# Patient Record
Sex: Male | Born: 1990 | Race: Black or African American | Hispanic: No | Marital: Single | Smoking: Current every day smoker
Health system: Southern US, Community
[De-identification: ages and names within clinical notes are randomized; demographics above are authoritative.]

## PROBLEM LIST (undated history)

## (undated) DIAGNOSIS — D573 Sickle-cell trait: Secondary | ICD-10-CM

## (undated) DIAGNOSIS — S60312A Abrasion of left thumb, initial encounter: Secondary | ICD-10-CM

## (undated) DIAGNOSIS — I7300 Raynaud's syndrome without gangrene: Secondary | ICD-10-CM

## (undated) DIAGNOSIS — M2652 Limited mandibular range of motion: Secondary | ICD-10-CM

## (undated) DIAGNOSIS — S02609A Fracture of mandible, unspecified, initial encounter for closed fracture: Secondary | ICD-10-CM

---

## 2012-08-23 ENCOUNTER — Emergency Department (HOSPITAL_COMMUNITY)
Admission: EM | Admit: 2012-08-23 | Discharge: 2012-08-23 | Disposition: A | Discharge status: Home / Self Care | Payer: Self-pay | Attending: Emergency Medicine | Admitting: Emergency Medicine

## 2012-08-23 ENCOUNTER — Encounter (HOSPITAL_COMMUNITY): Payer: Self-pay | Admitting: Emergency Medicine

## 2012-08-23 DIAGNOSIS — M25561 Pain in right knee: Secondary | ICD-10-CM

## 2012-08-23 DIAGNOSIS — M25569 Pain in unspecified knee: Secondary | ICD-10-CM | POA: Insufficient documentation

## 2012-08-23 DIAGNOSIS — M25579 Pain in unspecified ankle and joints of unspecified foot: Secondary | ICD-10-CM | POA: Insufficient documentation

## 2012-08-23 HISTORY — DX: Raynaud's syndrome without gangrene: I73.00

## 2012-08-23 NOTE — ED Notes (Signed)
States right knee "gave out" yesterday while at home. Pt does play a lot of basketball but was not playing when the injury occurred.

## 2012-08-23 NOTE — ED Provider Notes (Signed)
History     CSN: [id number]  Arrival date & time 08/23/12  1508   First MD Initiated Contact with Patient 08/23/12 1520      No chief complaint on file.   (Consider location/radiation/quality/duration/timing/severity/associated sxs/prior treatment) HPI  21 year old male presents complaining of right knee pain. Patient reports he plays basketball often and has had trouble with his right leg including high ankle sprain, bursitis, and general wear and tear. Yesterday while walking patient reports his "knees gave out". He denies falling or injuring any body parts. He does complaining of achy pain to his right knee worsened throughout the night but has improved. He did take some ibuprofen for pain. He denies hip or ankle pain. He denies swelling, or bleeding. Reports his knee has never "gave out" before.    No past medical history on file.  No past surgical history on file.  No family history on file.  History  Substance Use Topics  . Smoking status: Not on file  . Smokeless tobacco: Not on file  . Alcohol Use: Not on file      Review of Systems  Constitutional: Negative for fever.  Musculoskeletal: Positive for arthralgias. Negative for joint swelling.  Skin: Negative for rash and wound.  Neurological: Negative for numbness.    Allergies  Review of patient's allergies indicates not on file.  Home Medications  No current outpatient prescriptions on file.  There were no vitals taken for this visit.  Physical Exam  Nursing note and vitals reviewed. Constitutional: He appears well-developed and well-nourished. No distress.  HENT:  Head: Atraumatic.  Eyes: Conjunctivae normal are normal.  Neck: Neck supple.  Musculoskeletal:       Right hip: Normal.       Right knee: He exhibits normal range of motion, no swelling, no effusion, no ecchymosis, no deformity, no erythema, no LCL laxity, normal patellar mobility, no bony tenderness, normal meniscus and no MCL laxity.  tenderness found. Patellar tendon tenderness noted. No MCL tenderness noted.       Right ankle: Normal.       Legs: Neurological: He is alert.  Skin: Skin is warm. No rash noted.  Psychiatric: He has a normal mood and affect.    ED Course  Procedures (including critical care time)  Labs Reviewed - No data to display No results found.   No diagnosis found.  1. R ankle pain  MDM  Tenderness to the infrapatellar region of right knee without deformity. No laxity, overlying skin changes, or swelling noted. Patient able to ambulate without difficulty. leg without palpable cords, erythema or swelling. X-ray is not warranted although offered, the patient declined. Knee sleeve given for support, although referral given. Recommend ibuprofen and RICE therapy.     BP 129/90  Pulse 90  Temp 98.7 F (37.1 C) (Oral)  Resp 18  SpO2 100%      Fayrene Helper, PA-C 08/23/12 1535

## 2012-08-23 NOTE — ED Provider Notes (Signed)
Medical screening examination/treatment/procedure(s) were performed by non-physician practitioner and as supervising physician I was immediately available for consultation/collaboration.  Kilie Rund, MD 08/23/12 2313 

## 2013-06-20 ENCOUNTER — Emergency Department (HOSPITAL_COMMUNITY): Payer: PRIVATE HEALTH INSURANCE | Admitting: Registered Nurse

## 2013-06-20 ENCOUNTER — Emergency Department (HOSPITAL_COMMUNITY): Payer: PRIVATE HEALTH INSURANCE

## 2013-06-20 ENCOUNTER — Encounter (HOSPITAL_COMMUNITY): Payer: Self-pay | Admitting: Registered Nurse

## 2013-06-20 ENCOUNTER — Encounter (HOSPITAL_COMMUNITY)
Admission: EM | Disposition: A | Discharge status: Home / Self Care | Payer: Self-pay | Source: Home / Self Care | Attending: Emergency Medicine

## 2013-06-20 ENCOUNTER — Observation Stay (HOSPITAL_COMMUNITY)
Admission: EM | Admit: 2013-06-20 | Discharge: 2013-06-21 | Disposition: A | Discharge status: Home / Self Care | Payer: PRIVATE HEALTH INSURANCE | Attending: Otolaryngology | Admitting: Otolaryngology

## 2013-06-20 ENCOUNTER — Encounter (HOSPITAL_COMMUNITY): Payer: Self-pay | Admitting: Emergency Medicine

## 2013-06-20 DIAGNOSIS — W2203XA Walked into furniture, initial encounter: Secondary | ICD-10-CM | POA: Insufficient documentation

## 2013-06-20 DIAGNOSIS — S02609A Fracture of mandible, unspecified, initial encounter for closed fracture: Principal | ICD-10-CM | POA: Insufficient documentation

## 2013-06-20 DIAGNOSIS — I7300 Raynaud's syndrome without gangrene: Secondary | ICD-10-CM | POA: Insufficient documentation

## 2013-06-20 HISTORY — DX: Sickle-cell trait: D57.3

## 2013-06-20 HISTORY — DX: Fracture of mandible, unspecified, initial encounter for closed fracture: S02.609A

## 2013-06-20 HISTORY — PX: ORIF MANDIBULAR FRACTURE: SHX2127

## 2013-06-20 SURGERY — OPEN REDUCTION INTERNAL FIXATION (ORIF) MANDIBULAR FRACTURE
Anesthesia: General | Site: Mouth | Wound class: Clean Contaminated

## 2013-06-20 MED ORDER — HYDROMORPHONE HCL PF 1 MG/ML IJ SOLN
INTRAMUSCULAR | Status: AC
  Filled 2013-06-20: qty 1

## 2013-06-20 MED ORDER — CLINDAMYCIN PHOSPHATE 600 MG/50ML IV SOLN
INTRAVENOUS | Status: DC | PRN
  Administered 2013-06-20: 600 mg via INTRAVENOUS

## 2013-06-20 MED ORDER — OXYMETAZOLINE HCL 0.05 % NA SOLN
NASAL | Status: AC
  Filled 2013-06-20: qty 15

## 2013-06-20 MED ORDER — SUCCINYLCHOLINE CHLORIDE 20 MG/ML IJ SOLN
INTRAMUSCULAR | Status: DC | PRN
  Administered 2013-06-20: 100 mg via INTRAVENOUS

## 2013-06-20 MED ORDER — LIDOCAINE-EPINEPHRINE 2 %-1:100000 IJ SOLN
INTRAMUSCULAR | Status: AC
  Filled 2013-06-20: qty 6.8

## 2013-06-20 MED ORDER — CLINDAMYCIN PHOSPHATE 600 MG/50ML IV SOLN
600.0000 mg | Freq: Once | INTRAVENOUS | Status: DC
  Filled 2013-06-20: qty 50

## 2013-06-20 MED ORDER — LIDOCAINE HCL (CARDIAC) 20 MG/ML IV SOLN
INTRAVENOUS | Status: DC | PRN
  Administered 2013-06-20: 50 mg via INTRAVENOUS

## 2013-06-20 MED ORDER — LIDOCAINE HCL 1 % IJ SOLN
INTRAMUSCULAR | Status: AC
  Filled 2013-06-20: qty 20

## 2013-06-20 MED ORDER — SUFENTANIL CITRATE 50 MCG/ML IV SOLN
INTRAVENOUS | Status: DC | PRN
  Administered 2013-06-20: 5 ug via INTRAVENOUS
  Administered 2013-06-20: 10 ug via INTRAVENOUS
  Administered 2013-06-20: 5 ug via INTRAVENOUS

## 2013-06-20 MED ORDER — LACTATED RINGERS IV SOLN: INTRAVENOUS | Status: DC

## 2013-06-20 MED ORDER — DEXAMETHASONE SODIUM PHOSPHATE 10 MG/ML IJ SOLN
INTRAMUSCULAR | Status: DC | PRN
  Administered 2013-06-20: 10 mg via INTRAVENOUS

## 2013-06-20 MED ORDER — CLINDAMYCIN HCL 300 MG PO CAPS: 300.0000 mg | ORAL_CAPSULE | Freq: Three times a day (TID) | ORAL | Status: DC

## 2013-06-20 MED ORDER — 0.9 % SODIUM CHLORIDE (POUR BTL) OPTIME
TOPICAL | Status: DC | PRN
  Administered 2013-06-20: 1000 mL

## 2013-06-20 MED ORDER — HYDROMORPHONE HCL PF 1 MG/ML IJ SOLN
0.2500 mg | INTRAMUSCULAR | Status: DC | PRN
  Administered 2013-06-20 (×2): 0.5 mg via INTRAVENOUS

## 2013-06-20 MED ORDER — ROCURONIUM BROMIDE 100 MG/10ML IV SOLN
INTRAVENOUS | Status: DC | PRN
  Administered 2013-06-20: 5 mg via INTRAVENOUS

## 2013-06-20 MED ORDER — PROPOFOL 10 MG/ML IV BOLUS
INTRAVENOUS | Status: DC | PRN
  Administered 2013-06-20: 180 mg via INTRAVENOUS

## 2013-06-20 MED ORDER — PROMETHAZINE HCL 25 MG RE SUPP: 25.0000 mg | Freq: Four times a day (QID) | RECTAL | Status: DC | PRN

## 2013-06-20 MED ORDER — PROMETHAZINE HCL 25 MG/ML IJ SOLN: 6.2500 mg | INTRAMUSCULAR | Status: DC | PRN

## 2013-06-20 MED ORDER — OXYMETAZOLINE HCL 0.05 % NA SOLN
NASAL | Status: DC | PRN
  Administered 2013-06-20: 2 via NASAL

## 2013-06-20 MED ORDER — HYDROCODONE-ACETAMINOPHEN 7.5-325 MG/15ML PO SOLN: 15.0000 mL | ORAL | Status: DC | PRN

## 2013-06-20 MED ORDER — LIDOCAINE-EPINEPHRINE (PF) 1 %-1:200000 IJ SOLN
INTRAMUSCULAR | Status: DC | PRN
  Administered 2013-06-20: 2 mL

## 2013-06-20 MED ORDER — LACTATED RINGERS IV SOLN
INTRAVENOUS | Status: DC | PRN
  Administered 2013-06-20: 21:00:00 via INTRAVENOUS

## 2013-06-20 MED ORDER — MIDAZOLAM HCL 5 MG/5ML IJ SOLN
INTRAMUSCULAR | Status: DC | PRN
  Administered 2013-06-20: 2 mg via INTRAVENOUS

## 2013-06-20 MED ORDER — LIDOCAINE-EPINEPHRINE (PF) 1 %-1:200000 IJ SOLN
INTRAMUSCULAR | Status: AC
  Filled 2013-06-20: qty 10

## 2013-06-20 SURGICAL SUPPLY — 43 items
ATTRACTOMAT 16X20 MAGNETIC DRP (DRAPES) IMPLANT
BIT DRILL TWIST 1.3X35MM (BIT) ×1 IMPLANT
BLADE SURG 15 STRL LF DISP TIS (BLADE) ×2 IMPLANT
BLADE SURG 15 STRL SS (BLADE) ×2
CLEANER TIP ELECTROSURG 2X2 (MISCELLANEOUS) ×2 IMPLANT
CLOTH BEACON ORANGE TIMEOUT ST (SAFETY) ×2 IMPLANT
COVER SURGICAL LIGHT HANDLE (MISCELLANEOUS) ×2 IMPLANT
DEPRESSOR TONGUE BLADE STERILE (MISCELLANEOUS) IMPLANT
DRILL TWIST 1.3X35MM (BIT) ×2
ELECT COATED BLADE 2.86 ST (ELECTRODE) ×2 IMPLANT
ELECT NEEDLE TIP 2.8 STRL (NEEDLE) IMPLANT
ELECT REM PT RETURN 9FT ADLT (ELECTROSURGICAL) ×2
ELECTRODE REM PT RTRN 9FT ADLT (ELECTROSURGICAL) ×1 IMPLANT
GAUZE SPONGE 4X4 16PLY XRAY LF (GAUZE/BANDAGES/DRESSINGS) ×2 IMPLANT
KIT BASIN OR (CUSTOM PROCEDURE TRAY) ×2 IMPLANT
NEEDLE 27GAX1X1/2 (NEEDLE) ×2 IMPLANT
NEEDLE HYPO 25X1 1.5 SAFETY (NEEDLE) IMPLANT
NS IRRIG 1000ML POUR BTL (IV SOLUTION) ×2 IMPLANT
PACK EENT SPLIT (PACKS) ×2 IMPLANT
PACKING VAGINAL (PACKING) IMPLANT
PENCIL BUTTON HOLSTER BLD 10FT (ELECTRODE) ×2 IMPLANT
RUBBERBAND STERILE (MISCELLANEOUS) IMPLANT
SCREW 2.0X12MM (Screw) ×4 IMPLANT
SCREW MNDBLE 2.0X8 LOCKING (Screw) ×4 IMPLANT
SOL PREP POV-IOD 16OZ 10% (MISCELLANEOUS) IMPLANT
SPONGE GAUZE 4X4 12PLY (GAUZE/BANDAGES/DRESSINGS) IMPLANT
SUT BONE WAX W31G (SUTURE) IMPLANT
SUT STEEL 0 (SUTURE)
SUT STEEL 0 18XMFL TIE 12 (SUTURE)
SUT STEEL 2 (SUTURE) ×2 IMPLANT
SUT VIC AB 3-0 PS2 18 (SUTURE)
SUT VIC AB 3-0 PS2 18XBRD (SUTURE) IMPLANT
SUT VIC AB 4-0 P-3 18XBRD (SUTURE) IMPLANT
SUT VIC AB 4-0 P3 18 (SUTURE)
SUT VIC AB 5-0 P-3 18XBRD (SUTURE) IMPLANT
SUT VIC AB 5-0 P3 18 (SUTURE)
SUTURE STEEL 0 18XMFL TIE 12 (SUTURE) IMPLANT
SYR 50ML LL SCALE MARK (SYRINGE) IMPLANT
SYR BULB IRRIGATION 50ML (SYRINGE) ×2 IMPLANT
SYR CONTROL 10ML LL (SYRINGE) ×2 IMPLANT
TOOTHBRUSH ADULT (PERSONAL CARE ITEMS) IMPLANT
TOWEL OR 17X26 10 PK STRL BLUE (TOWEL DISPOSABLE) ×2 IMPLANT
WATER STERILE IRR 1500ML POUR (IV SOLUTION) IMPLANT

## 2013-06-20 NOTE — Preoperative (Signed)
Beta Blockers   Reason not to administer Beta Blockers:Not Applicable 

## 2013-06-20 NOTE — ED Provider Notes (Signed)
Medical screening examination/treatment/procedure(s) were performed by non-physician practitioner and as supervising physician I was immediately available for consultation/collaboration.    Nelia Shi, MD 06/20/13 2028

## 2013-06-20 NOTE — ED Notes (Signed)
Surgeon at bedside. Admission pending

## 2013-06-20 NOTE — Anesthesia Postprocedure Evaluation (Signed)
Anesthesia Post Note  Patient: Corey Flores  Procedure(s) Performed: Procedure(s) (LRB): OPEN REDUCTION INTERNAL FIXATION (ORIF) MANDIBULAR FRACTURE (N/A)  Anesthesia type: General  Patient location: PACU  Post pain: Pain level controlled  Post assessment: Post-op Vital signs reviewed  Last Vitals:  Filed Vitals:   06/20/13 1850  BP: 118/75  Pulse: 50  Temp:   Resp: 16    Post vital signs: Reviewed  Level of consciousness: sedated  Complications: No apparent anesthesia complications

## 2013-06-20 NOTE — ED Provider Notes (Signed)
History    This chart was scribed for non-physician practitioner Roxy Horseman PA-C, working with Nelia Shi, MD by Donne Anon, ED Scribe. This patient was seen in room WTR9/WTR9 and the patient's care was started at 1459.  CSN: [id number] Arrival date & time 06/20/13  1414  First MD Initiated Contact with Patient 06/20/13 1459     Chief Complaint  Patient presents with  . Jaw Pain    The history is provided by the patient. No language interpreter was used.   HPI Comments: Corey Flores is a 22 y.o. male who presents to the Emergency Department complaining of 3 days of sudden onset, gradually worsening, constant, non radiating, moderate right jaw pain with associated swelling that began when he accidentally hit his jaw on a cabinet in the bathroom. He states he accidentally hit his jaw again yesterday which worsened the pain. He rates his pain 6/10. He reports difficulty eating solid food. He has tried 800 mg ibuprofen and Orajel with little relief. He denies fever, nausea, vomiting, or any other pain.  Past Medical History  Diagnosis Date  . Raynaud's disease    History reviewed. No pertinent past surgical history. No family history on file. History  Substance Use Topics  . Smoking status: Not on file  . Smokeless tobacco: Not on file  . Alcohol Use: No    Review of Systems A complete 10 system review of systems was obtained and all systems are negative except as noted in the HPI and PMH.   Allergies  Sulfa antibiotics  Home Medications   Current Outpatient Rx  Name  Route  Sig  Dispense  Refill  . ibuprofen (ADVIL,MOTRIN) 200 MG tablet   Oral   Take 200 mg by mouth every 6 (six) hours as needed for pain (pain).          There were no vitals taken for this visit.  Physical Exam  Nursing note and vitals reviewed. Constitutional: He appears well-developed and well-nourished. No distress.  HENT:  Head: Normocephalic and atraumatic. No trismus in the jaw.   Mouth/Throat: Uvula is midline and oropharynx is clear and moist. No dental abscesses.  No broken teeth. No evidence of abscess. Oropharynx clear. Uvula midline. No sign of Ludwig's angina. No trismus. No evidence of TMJ. Mild tenderness to palpation of right lateral mandible.   Eyes: Conjunctivae are normal.  Neck: Neck supple. No tracheal deviation present.  Cardiovascular: Normal rate.   Pulmonary/Chest: Effort normal. No respiratory distress.  Musculoskeletal: Normal range of motion.  Neurological: He is alert.  Skin: Skin is warm and dry.  Psychiatric: He has a normal mood and affect. His behavior is normal.    ED Course  Procedures (including critical care time) DIAGNOSTIC STUDIES:   COORDINATION OF CARE: 3:13 PM Discussed treatment plan which includes facial xray with pt at bedside and pt agreed to plan.   3:54 PM Case discussed with Dr. Radford Pax, who recommenced consulting with Dr. Pollyann Kennedy from [facility].   4:10 PM Dr. Radford Pax spoke with Dr. Pollyann Kennedy who agreed to evaluate pt in the ED. Dr. Pollyann Kennedy requested pt be NPO until evaluated.   4:26 PM Rechecked pt. Discussed xray results and updated about consult with Dr. Pollyann Kennedy. Pt agrees to plan.   4:56 PM Dr. Pollyann Kennedy evaluated pt, who will admit pt for surgery.    Labs Reviewed - No data to display Dg Mandible 4 Views  06/20/2013   *RADIOLOGY REPORT*  Clinical Data: Blunt trauma to right  side of jaw, right side jaw pain, unable to close mouth all the way  MANDIBLE - 4+ VIEW  Comparison: None.  Findings: Osseous mineralization normal. Foci of mandibular fracture identified near angle of mandible immediately posterior to tooth #17. Question nondisplaced anterior right mandibular fracture versus artifact. No additional fracture or TMJ dislocation identified.  IMPRESSION: Posterior left mandibular fracture, essentially nondisplaced. Questionable nondisplaced anterior right mandibular fracture versus artifact; recommend correlation for  pain/tenderness at this site.   Original Report Authenticated By: Ulyses Southward, M.D.   1. Mandible fracture, closed, initial encounter     MDM  Patient with mandible fracture.  Will be admitted for surgery.  I personally performed the services described in this documentation, which was scribed in my presence. The recorded information has been reviewed and is accurate.    Roxy Horseman, PA-C 06/20/13 1927

## 2013-06-20 NOTE — Progress Notes (Signed)
[facility] has seen patient and provided him with a list of primary care resources. °

## 2013-06-20 NOTE — H&P (Signed)
Corey Flores is an 22 y.o. male.   Chief Complaint: Facial injury, jaw pain HPI: He accidentally hit his face into a cabinet 2 days ago at work. He had pain mainly on the right side ever since. No prior history of facial injury.  Past Medical History  Diagnosis Date  . Raynaud's disease     History reviewed. No pertinent past surgical history.  Family History  Problem Relation Age of Onset  . Hypertension Mother   . Hypertension Other    Social History:  reports that he has never smoked. He does not have any smokeless tobacco history on file. He reports that he does not drink alcohol. His drug history is not on file.  Allergies:  Allergies  Allergen Reactions  . Sulfa Antibiotics     Unknown     (Not in a hospital admission)  No results found for this or any previous visit (from the past 48 hour(s)). Dg Mandible 4 Views  06/20/2013   *RADIOLOGY REPORT*  Clinical Data: Blunt trauma to right side of jaw, right side jaw pain, unable to close mouth all the way  MANDIBLE - 4+ VIEW  Comparison: None.  Findings: Osseous mineralization normal. Foci of mandibular fracture identified near angle of mandible immediately posterior to tooth #17. Question nondisplaced anterior right mandibular fracture versus artifact. No additional fracture or TMJ dislocation identified.  IMPRESSION: Posterior left mandibular fracture, essentially nondisplaced. Questionable nondisplaced anterior right mandibular fracture versus artifact; recommend correlation for pain/tenderness at this site.   Original Report Authenticated By: Ulyses Southward, M.D.    ROS: otherwise negative  Blood pressure 132/88, pulse 57, temperature 98.3 F (36.8 C), temperature source Oral, resp. rate 18, weight 120 lb (54.432 kg), SpO2 100.00%.  PHYSICAL EXAM: Overall appearance:  Healthy appearing, in no distress Head:  Normocephalic, atraumatic. Ears: External ears normal. Nose: External nose is healthy in appearance. Internal nasal  exam free of any lesions or obstruction. Oral Cavity/pharynx:  There are no mucosal lesions or masses identified. He is most tender along the left angle of the mandible. Hypopharynx/Larynx: no signs of any mucosal lesions or masses identified.  Neuro:  No identifiable neurologic deficits. Neck: No palpable neck masses.  Studies Reviewed: Panorex reveals left angle fracture possible right anterior ramus fracture.    Assessment/Plan Minimally displaced left angle fracture, possible right anterior ramus fracture, both favorable. Recommend closed reduction with maxillomandibular fixation using 4 screws and wires. We discussed the nature of the procedure, the need for fixation for 6 weeks. All questions were answered. We will do this this evening.  Rhyli Depaula 06/20/2013, 5:18 PM

## 2013-06-20 NOTE — ED Notes (Signed)
Pt was advised by surgeon that he will be taken to surgery later today to get his "jaw wired" due to fracture

## 2013-06-20 NOTE — Transfer of Care (Signed)
Immediate Anesthesia Transfer of Care Note  Patient: Corey Flores  Procedure(s) Performed: Procedure(s): OPEN REDUCTION INTERNAL FIXATION (ORIF) MANDIBULAR FRACTURE (N/A)  Patient Location: PACU  Anesthesia Type:General  Level of Consciousness: awake, alert , oriented and patient cooperative  Airway & Oxygen Therapy: Patient Spontanous Breathing and Patient connected to face mask oxygen  Post-op Assessment: Report given to PACU RN, Post -op Vital signs reviewed and stable and Patient moving all extremities  Post vital signs: Reviewed and stable  Complications: No apparent anesthesia complications

## 2013-06-20 NOTE — Anesthesia Preprocedure Evaluation (Signed)
Anesthesia Evaluation  Patient identified by MRN, date of birth, ID band Patient awake    Reviewed: Allergy & Precautions, H&P , NPO status , Patient's Chart, lab work & pertinent test results  Airway Mallampati: II TM Distance: >3 FB Neck ROM: Full  Mouth opening: Limited Mouth Opening  Dental  (+) Teeth Intact and Dental Advisory Given,    Pulmonary neg pulmonary ROS,    Pulmonary exam normal       Cardiovascular negative cardio ROS      Neuro/Psych negative neurological ROS  negative psych ROS   GI/Hepatic negative GI ROS, Neg liver ROS,   Endo/Other  negative endocrine ROS  Renal/GU negative Renal ROS  negative genitourinary   Musculoskeletal negative musculoskeletal ROS (+)   Abdominal   Peds  Hematology negative hematology ROS (+)   Anesthesia Other Findings   Reproductive/Obstetrics                           Anesthesia Physical Anesthesia Plan  ASA: I and emergent  Anesthesia Plan: General   Post-op Pain Management:    Induction: Intravenous  Airway Management Planned: Nasal ETT  Additional Equipment:   Intra-op Plan:   Post-operative Plan: Extubation in OR  Informed Consent: I have reviewed the patients History and Physical, chart, labs and discussed the procedure including the risks, benefits and alternatives for the proposed anesthesia with the patient or authorized representative who has indicated his/her understanding and acceptance.   Dental advisory given  Plan Discussed with: CRNA  Anesthesia Plan Comments:         Anesthesia Quick Evaluation

## 2013-06-20 NOTE — ED Notes (Signed)
Pt states that he hurt his rt side jaw 3 days ago and has pain with swelling.

## 2013-06-21 ENCOUNTER — Encounter (HOSPITAL_COMMUNITY): Payer: Self-pay | Admitting: *Deleted

## 2013-06-21 MED ORDER — CLINDAMYCIN PALMITATE HCL 75 MG/5ML PO SOLR
300.0000 mg | Freq: Three times a day (TID) | ORAL | Status: DC
  Administered 2013-06-21 (×2): 300 mg via ORAL
  Filled 2013-06-21 (×4): qty 20

## 2013-06-21 MED ORDER — CLINDAMYCIN PALMITATE HCL 75 MG/5ML PO SOLR
300.0000 mg | Freq: Three times a day (TID) | ORAL | Status: DC
  Filled 2013-06-21 (×2): qty 20

## 2013-06-21 MED ORDER — BIOTENE DRY MOUTH MT LIQD: 15.0000 mL | Freq: Two times a day (BID) | OROMUCOSAL | Status: DC

## 2013-06-21 MED ORDER — HYDROCODONE-ACETAMINOPHEN 7.5-325 MG/15ML PO SOLN
10.0000 mL | ORAL | Status: DC | PRN
  Administered 2013-06-21: 15 mL via ORAL
  Administered 2013-06-21 (×2): 10 mL via ORAL
  Filled 2013-06-21 (×3): qty 15

## 2013-06-21 MED ORDER — CHLORHEXIDINE GLUCONATE 0.12 % MT SOLN
15.0000 mL | Freq: Four times a day (QID) | OROMUCOSAL | Status: DC
  Administered 2013-06-21: 15 mL via OROMUCOSAL
  Filled 2013-06-21 (×4): qty 15

## 2013-06-21 MED ORDER — PROMETHAZINE HCL 25 MG RE SUPP
25.0000 mg | Freq: Four times a day (QID) | RECTAL | Status: DC | PRN
  Administered 2013-06-21: 25 mg via RECTAL
  Filled 2013-06-21: qty 1

## 2013-06-21 MED ORDER — DEXTROSE-NACL 5-0.9 % IV SOLN
INTRAVENOUS | Status: DC
  Administered 2013-06-21: 01:00:00 via INTRAVENOUS

## 2013-06-21 MED ORDER — PROMETHAZINE HCL 25 MG PO TABS
25.0000 mg | ORAL_TABLET | Freq: Four times a day (QID) | ORAL | Status: DC | PRN
  Administered 2013-06-21: 25 mg via ORAL
  Filled 2013-06-21: qty 1

## 2013-06-21 NOTE — Discharge Summary (Signed)
Physician Discharge Summary  Patient ID: Corey Flores MRN: [id number] DOB/AGE: [birth year] 22 y.o.  Admit date: 06/20/2013 Discharge date: 06/21/2013  Admission Diagnoses: Mandible fracture  Discharge Diagnoses:  Active Problems:   * No active hospital problems. *   Discharged Condition: good  Hospital Course: No  Consults: none  Significant Diagnostic Studies: none  Treatments: surgery: MMF  Discharge Exam: Blood pressure 152/89, pulse 68, temperature 96.5 F (35.8 C), temperature source Axillary, resp. rate 16, height 5\' 5"  (1.651 m), weight 120 lb (54.432 kg), SpO2 100.00%. PHYSICAL EXAM: Doing well, MMF in place and stable. He is tolerating liquids well. He is tolerating his pain medicine well.  Disposition: 01-Home or Self Care  Discharge Orders   Future Orders Complete By Expires     Increase activity slowly  As directed         Medication List         clindamycin 300 MG capsule  Commonly known as:  CLEOCIN  Take 1 capsule (300 mg total) by mouth 3 (three) times daily.     HYDROcodone-acetaminophen 7.5-325 mg/15 ml solution  Commonly known as:  HYCET  Take 15 mLs by mouth every 4 (four) hours as needed for pain.     ibuprofen 200 MG tablet  Commonly known as:  ADVIL,MOTRIN  Take 200 mg by mouth every 6 (six) hours as needed for pain (pain).     promethazine 25 MG suppository  Commonly known as:  PHENERGAN  Place 1 suppository (25 mg total) rectally every 6 (six) hours as needed for nausea.           Follow-up Information   Follow up with Serena Colonel, MD. Schedule an appointment as soon as possible for a visit in 1 week.   Contact information:   [address] [place] [address] [phone]       Signed: Serena Colonel 06/21/2013, 2:31 PM

## 2013-06-21 NOTE — Op Note (Signed)
OPERATIVE REPORT  DATE OF SURGERY: 06/21/2013  PATIENT:  Corey Flores,  22 y.o. male  PRE-OPERATIVE DIAGNOSIS:  MANDIBLE FRACTURE  POST-OPERATIVE DIAGNOSIS:  mandible fracture  PROCEDURE:  Procedure(s): Maxillomandibular fixation for MANDIBULAR FRACTURE  SURGEON:  Susy Frizzle, MD  ASSISTANTS: None   ANESTHESIA:   General   EBL:  15 ml  DRAINS: XXX   LOCAL MEDICATIONS USED:  None  SPECIMEN:  none  COUNTS:  Correct  PROCEDURE DETAILS: The patient was taken to the operating room and placed on the operating table in the supine position. Following induction of general endotracheal anesthesia using nasal airway, the patient was draped in a standard fashion. Oral cavity was inspected. 1% Xylocaine with epinephrine was infiltrated into 4 quadrants where the proposed screws were replaced. In the maxilla, this was medial to the canines. In the mandible, this was lateral to the canines. Electrocautery used to incise the mucosa down to the periosteum which was stripped. Bicortical screws were used, 8 mm in the maxilla, 12 mm in the mandible. Self drilling screws were used. These were placed securely. 24-gauge wire was used to suture the fixation. The occlusion was brought into its native position. It appeared to be his normal occlusion was obtained. 2 loops were secured up and down and two across. The mouth was irrigated with saline and suctioned. Patient was then awakened, extubated and transferred to recovery in stable condition.    PATIENT DISPOSITION:  To PACU, stable

## 2013-06-21 NOTE — Plan of Care (Signed)
Problem: Discharge Progression Outcomes Goal: Other Discharge Outcomes/Goals Wire cutters given, with instructions to patient and mother

## 2013-06-21 NOTE — Progress Notes (Signed)
Utilization review completed.  

## 2013-06-26 ENCOUNTER — Encounter (HOSPITAL_COMMUNITY): Payer: Self-pay | Admitting: Otolaryngology

## 2013-08-05 ENCOUNTER — Encounter (HOSPITAL_BASED_OUTPATIENT_CLINIC_OR_DEPARTMENT_OTHER): Payer: Self-pay | Admitting: *Deleted

## 2013-08-05 DIAGNOSIS — S60312A Abrasion of left thumb, initial encounter: Secondary | ICD-10-CM

## 2013-08-05 HISTORY — DX: Abrasion of left thumb, initial encounter: S60.312A

## 2013-08-06 ENCOUNTER — Encounter (HOSPITAL_BASED_OUTPATIENT_CLINIC_OR_DEPARTMENT_OTHER): Payer: Self-pay | Admitting: Certified Registered Nurse Anesthetist

## 2013-08-06 ENCOUNTER — Encounter (HOSPITAL_BASED_OUTPATIENT_CLINIC_OR_DEPARTMENT_OTHER)
Admission: RE | Disposition: A | Discharge status: Home / Self Care | Payer: Self-pay | Source: Ambulatory Visit | Attending: Otolaryngology

## 2013-08-06 ENCOUNTER — Ambulatory Visit (HOSPITAL_BASED_OUTPATIENT_CLINIC_OR_DEPARTMENT_OTHER)
Admission: RE | Admit: 2013-08-06 | Discharge: 2013-08-06 | Disposition: A | Discharge status: Home / Self Care | Payer: PRIVATE HEALTH INSURANCE | Source: Ambulatory Visit | Attending: Otolaryngology | Admitting: Otolaryngology

## 2013-08-06 ENCOUNTER — Ambulatory Visit (HOSPITAL_BASED_OUTPATIENT_CLINIC_OR_DEPARTMENT_OTHER): Payer: PRIVATE HEALTH INSURANCE | Admitting: Certified Registered Nurse Anesthetist

## 2013-08-06 ENCOUNTER — Encounter (HOSPITAL_BASED_OUTPATIENT_CLINIC_OR_DEPARTMENT_OTHER): Payer: Self-pay | Admitting: Anesthesiology

## 2013-08-06 DIAGNOSIS — Z472 Encounter for removal of internal fixation device: Secondary | ICD-10-CM | POA: Insufficient documentation

## 2013-08-06 HISTORY — DX: Abrasion of left thumb, initial encounter: S60.312A

## 2013-08-06 HISTORY — DX: Fracture of mandible, unspecified, initial encounter for closed fracture: S02.609A

## 2013-08-06 HISTORY — PX: MANDIBULAR HARDWARE REMOVAL: SHX5205

## 2013-08-06 HISTORY — DX: Limited mandibular range of motion: M26.52

## 2013-08-06 SURGERY — REMOVAL, HARDWARE, MANDIBLE
Anesthesia: General | Site: Mouth | Wound class: Clean Contaminated

## 2013-08-06 MED ORDER — OXYCODONE HCL 5 MG/5ML PO SOLN: 5.0000 mg | Freq: Once | ORAL | Status: DC | PRN

## 2013-08-06 MED ORDER — OXYCODONE HCL 5 MG PO TABS: 5.0000 mg | ORAL_TABLET | Freq: Once | ORAL | Status: DC | PRN

## 2013-08-06 MED ORDER — LACTATED RINGERS IV SOLN
INTRAVENOUS | Status: DC
  Administered 2013-08-06 (×2): via INTRAVENOUS

## 2013-08-06 MED ORDER — FENTANYL CITRATE 0.05 MG/ML IJ SOLN: 50.0000 ug | INTRAMUSCULAR | Status: DC | PRN

## 2013-08-06 MED ORDER — MIDAZOLAM HCL 2 MG/ML PO SYRP: 12.0000 mg | ORAL_SOLUTION | Freq: Once | ORAL | Status: DC | PRN

## 2013-08-06 MED ORDER — MIDAZOLAM HCL 5 MG/5ML IJ SOLN
INTRAMUSCULAR | Status: DC | PRN
  Administered 2013-08-06: 2 mg via INTRAVENOUS

## 2013-08-06 MED ORDER — PROPOFOL INFUSION 10 MG/ML OPTIME
INTRAVENOUS | Status: DC | PRN
  Administered 2013-08-06: 10 mL via INTRAVENOUS

## 2013-08-06 MED ORDER — LIDOCAINE-EPINEPHRINE 1 %-1:100000 IJ SOLN
INTRAMUSCULAR | Status: DC | PRN
  Administered 2013-08-06: 2 mL

## 2013-08-06 MED ORDER — LIDOCAINE HCL (CARDIAC) 20 MG/ML IV SOLN
INTRAVENOUS | Status: DC | PRN
  Administered 2013-08-06: 100 mg via INTRAVENOUS

## 2013-08-06 MED ORDER — FENTANYL CITRATE 0.05 MG/ML IJ SOLN
INTRAMUSCULAR | Status: DC | PRN
  Administered 2013-08-06: 50 ug via INTRAVENOUS

## 2013-08-06 MED ORDER — CEFAZOLIN SODIUM-DEXTROSE 2-3 GM-% IV SOLR: 2.0000 g | INTRAVENOUS | Status: DC

## 2013-08-06 MED ORDER — MIDAZOLAM HCL 2 MG/2ML IJ SOLN: 1.0000 mg | INTRAMUSCULAR | Status: DC | PRN

## 2013-08-06 MED ORDER — HYDROMORPHONE HCL PF 1 MG/ML IJ SOLN
0.2500 mg | INTRAMUSCULAR | Status: DC | PRN
  Administered 2013-08-06 (×2): 0.5 mg via INTRAVENOUS

## 2013-08-06 MED ORDER — OXYMETAZOLINE HCL 0.05 % NA SOLN
2.0000 | NASAL | Status: DC
  Administered 2013-08-06 (×2): 2 via NASAL

## 2013-08-06 SURGICAL SUPPLY — 29 items
BLADE SURG 15 STRL LF DISP TIS (BLADE) ×1 IMPLANT
BLADE SURG 15 STRL SS (BLADE) ×1
CANISTER SUCTION 1200CC (MISCELLANEOUS) ×2 IMPLANT
CLOTH BEACON ORANGE TIMEOUT ST (SAFETY) ×2 IMPLANT
COVER MAYO STAND STRL (DRAPES) ×2 IMPLANT
DECANTER SPIKE VIAL GLASS SM (MISCELLANEOUS) ×2 IMPLANT
ELECT COATED BLADE 2.86 ST (ELECTRODE) IMPLANT
ELECT REM PT RETURN 9FT ADLT (ELECTROSURGICAL) ×2
ELECTRODE REM PT RTRN 9FT ADLT (ELECTROSURGICAL) ×1 IMPLANT
GAUZE SPONGE 4X4 16PLY XRAY LF (GAUZE/BANDAGES/DRESSINGS) IMPLANT
GLOVE BIO SURGEON STRL SZ7 (GLOVE) ×2 IMPLANT
GLOVE ECLIPSE 7.5 STRL STRAW (GLOVE) ×2 IMPLANT
GOWN PREVENTION PLUS XLARGE (GOWN DISPOSABLE) ×2 IMPLANT
GOWN PREVENTION PLUS XXLARGE (GOWN DISPOSABLE) ×2 IMPLANT
MARKER SKIN DUAL TIP RULER LAB (MISCELLANEOUS) IMPLANT
NEEDLE 27GAX1X1/2 (NEEDLE) ×2 IMPLANT
NS IRRIG 1000ML POUR BTL (IV SOLUTION) ×2 IMPLANT
PACK BASIN DAY SURGERY FS (CUSTOM PROCEDURE TRAY) ×2 IMPLANT
PENCIL FOOT CONTROL (ELECTRODE) IMPLANT
SCISSORS WIRE ANG 4 3/4 DISP (INSTRUMENTS) IMPLANT
SHEET MEDIUM DRAPE 40X70 STRL (DRAPES) ×2 IMPLANT
SUT CHROMIC 3 0 PS 2 (SUTURE) IMPLANT
SUT CHROMIC 4 0 PS 2 18 (SUTURE) ×2 IMPLANT
SYR BULB 3OZ (MISCELLANEOUS) ×2 IMPLANT
SYR CONTROL 10ML LL (SYRINGE) ×2 IMPLANT
TOWEL OR 17X24 6PK STRL BLUE (TOWEL DISPOSABLE) ×4 IMPLANT
TRAY DSU PREP LF (CUSTOM PROCEDURE TRAY) IMPLANT
TUBE CONNECTING 20X1/4 (TUBING) ×2 IMPLANT
YANKAUER SUCT BULB TIP NO VENT (SUCTIONS) ×2 IMPLANT

## 2013-08-06 NOTE — Anesthesia Preprocedure Evaluation (Addendum)
Anesthesia Evaluation  Patient identified by MRN, date of birth, ID band Patient awake    Reviewed: Allergy & Precautions, H&P , NPO status , Patient's Chart, lab work & pertinent test results  Airway      Comment: Jaws wired shut. Dental no notable dental hx.    Pulmonary Current Smoker,  breath sounds clear to auscultation  Pulmonary exam normal       Cardiovascular negative cardio ROS  Rhythm:Regular Rate:Normal     Neuro/Psych negative neurological ROS  negative psych ROS   GI/Hepatic negative GI ROS, Neg liver ROS,   Endo/Other  negative endocrine ROS  Renal/GU negative Renal ROS  negative genitourinary   Musculoskeletal   Abdominal   Peds  Hematology negative hematology ROS (+) Sickle cell trait ,   Anesthesia Other Findings   Reproductive/Obstetrics negative OB ROS                          Anesthesia Physical Anesthesia Plan  ASA: II  Anesthesia Plan: General   Post-op Pain Management:    Induction: Intravenous  Airway Management Planned: Mask  Additional Equipment:   Intra-op Plan:   Post-operative Plan:   Informed Consent: I have reviewed the patients History and Physical, chart, labs and discussed the procedure including the risks, benefits and alternatives for the proposed anesthesia with the patient or authorized representative who has indicated his/her understanding and acceptance.   Dental advisory given  Plan Discussed with: CRNA and Surgeon  Anesthesia Plan Comments:         Anesthesia Quick Evaluation

## 2013-08-06 NOTE — Op Note (Signed)
OPERATIVE REPORT  DATE OF SURGERY: 08/06/2013  PATIENT:  Corey Flores,  22 y.o. male  PRE-OPERATIVE DIAGNOSIS:  MANDIBLE FRACTURE  POST-OPERATIVE DIAGNOSIS:  MANDIBLE FRACTURE  PROCEDURE:  Procedure(s): MANDIBULAR HARDWARE REMOVAL  SURGEON:  Susy Frizzle, MD  ASSISTANTS: none  ANESTHESIA:   General   EBL:  10 ml  DRAINS: none  LOCAL MEDICATIONS USED:  1% Xylocaine with epinephrine  SPECIMEN:  none  COUNTS:  Correct  PROCEDURE DETAILS: The patient was taken to the operating room and placed on the operating table in the supine position. Following induction of intravenous sedation, the oral cavity was examined. The wires were cut and removed. 1% Xylocaine with epinephrine was infiltrated along 4 quadrants at the locations of the screws. A 15 scalpel was used to incise mucosa and dissected down to the screw. All the screws were removed along with the remaining wire fragments. The 2 mandibular incisions were reapproximated with a single chromic suture. The oral cavity was irrigated with saline and suctioned. The patient was awakened and transferred to recovery in stable condition.    PATIENT DISPOSITION:  To PACU, stable

## 2013-08-06 NOTE — Transfer of Care (Signed)
Immediate Anesthesia Transfer of Care Note  Patient: Corey Flores  Procedure(s) Performed: Procedure(s): MANDIBULAR HARDWARE REMOVAL (N/A)  Patient Location: PACU  Anesthesia Type:General  Level of Consciousness: sedated and patient cooperative  Airway & Oxygen Therapy: Patient Spontanous Breathing and Patient connected to face mask oxygen  Post-op Assessment: Report given to PACU RN and Post -op Vital signs reviewed and stable  Post vital signs: Reviewed and stable  Complications: No apparent anesthesia complications

## 2013-08-06 NOTE — H&P (Signed)
Assessment  Fracture of angle of mandible (802.25) (S02.65XA). Reason For Visit  Po mandible fx. Discussed  6 weeks post MMF, doing well. Occlusion looks good. No swelling or signs of infection. Recommend we take the MMF down as soon as possible. Allergies  Sulfa Drugs. Current Meds  Promethazine HCl TABS;; RPT Hydrocodone-Acetaminophen TABS;; RPT Clindamycin HCl - 300 MG Oral Capsule;; RPT Ibuprofen TABS;; RPT Hydrocodone-Acetaminophen 7.5-325 MG/15ML Oral Solution;Take 15 ml every 4 hrs prn pain; Rx. Active Problems  Fracture of angle of mandible   (802.25) (S02.65XA) Migraine headache   (346.90) (G43.909). Signature  Electronically signed by : Serena Colonel  M.D.; 08/05/2013 9:57 AM EST.

## 2013-08-06 NOTE — Anesthesia Postprocedure Evaluation (Signed)
  Anesthesia Post-op Note  Patient: Corey Flores  Procedure(s) Performed: Procedure(s): MANDIBULAR HARDWARE REMOVAL (N/A)  Patient Location: PACU  Anesthesia Type:General  Level of Consciousness: awake, alert  and oriented  Airway and Oxygen Therapy: Patient Spontanous Breathing  Post-op Pain: mild  Post-op Assessment: Post-op Vital signs reviewed, Patient's Cardiovascular Status Stable, Respiratory Function Stable, Patent Airway and No signs of Nausea or vomiting  Post-op Vital Signs: Reviewed and stable  Complications: No apparent anesthesia complications

## 2013-08-07 ENCOUNTER — Encounter (HOSPITAL_BASED_OUTPATIENT_CLINIC_OR_DEPARTMENT_OTHER): Payer: Self-pay | Admitting: Otolaryngology

## 2016-02-25 ENCOUNTER — Encounter (HOSPITAL_COMMUNITY): Payer: Self-pay

## 2016-02-25 ENCOUNTER — Emergency Department (HOSPITAL_COMMUNITY)
Admission: EM | Admit: 2016-02-25 | Discharge: 2016-02-25 | Disposition: A | Discharge status: Home / Self Care | Payer: PRIVATE HEALTH INSURANCE | Attending: Emergency Medicine | Admitting: Emergency Medicine

## 2016-02-25 DIAGNOSIS — Z8781 Personal history of (healed) traumatic fracture: Secondary | ICD-10-CM | POA: Insufficient documentation

## 2016-02-25 DIAGNOSIS — Z862 Personal history of diseases of the blood and blood-forming organs and certain disorders involving the immune mechanism: Secondary | ICD-10-CM | POA: Insufficient documentation

## 2016-02-25 DIAGNOSIS — Z87828 Personal history of other (healed) physical injury and trauma: Secondary | ICD-10-CM | POA: Insufficient documentation

## 2016-02-25 DIAGNOSIS — J302 Other seasonal allergic rhinitis: Secondary | ICD-10-CM | POA: Insufficient documentation

## 2016-02-25 DIAGNOSIS — B309 Viral conjunctivitis, unspecified: Secondary | ICD-10-CM

## 2016-02-25 DIAGNOSIS — Z8679 Personal history of other diseases of the circulatory system: Secondary | ICD-10-CM | POA: Insufficient documentation

## 2016-02-25 DIAGNOSIS — F17210 Nicotine dependence, cigarettes, uncomplicated: Secondary | ICD-10-CM | POA: Insufficient documentation

## 2016-02-25 MED ORDER — ERYTHROMYCIN 5 MG/GM OP OINT
TOPICAL_OINTMENT | Freq: Once | OPHTHALMIC | Status: AC
  Administered 2016-02-25: 08:00:00 via OPHTHALMIC
  Filled 2016-02-25: qty 3.5

## 2016-02-25 NOTE — ED Notes (Signed)
Pt complains of red eyes for three days, he states they were matted shut this morning with some drainage

## 2016-02-25 NOTE — Discharge Instructions (Signed)
Viral Conjunctivitis Viral conjunctivitis is an inflammation of the clear membrane that covers the white part of your eye and the inner surface of your eyelid (conjunctiva). The inflammation is caused by a viral infection. The blood vessels in the conjunctiva become inflamed, causing the eye to become red or pink, and often itchy. Viral conjunctivitis can easily be passed from one person to another (contagious). CAUSES  Viral conjunctivitis is caused by a virus. A virus is a type of contagious germ. It can be spread by touching objects that have been contaminated with the virus, such as doorknobs or towels.  SYMPTOMS  Symptoms of viral conjunctivitis may include:   Eye redness.  Tearing or watery eyes.  Itchy eyes.  Burning feeling in the eyes.  Clear drainage from the eye.  Swollen eyelids.  A gritty feeling in the eye.  Light sensitivity. DIAGNOSIS  Viral conjunctivitis may be diagnosed with a medical history and physical exam. If you have discharge from your eye, the discharge may be tested to rule out other causes of conjunctivitis.  TREATMENT  Viral conjunctivitis does not respond to medicines that kill bacteria (antibiotics). Treatment for viral conjunctivitis is directed at stopping a bacterial infection from developing in addition to the viral infection. Treatment also aims to relieve your symptoms, such as itching. This may be done with antihistamine drops or other eye medicines. HOME CARE INSTRUCTIONS  Take medicines only as directed by your health care provider.  Avoid touching or rubbing your eyes.  Apply a warm, clean washcloth to your eye for 10-20 minutes, 3-4 times per day.  If you wear contact lenses, do not wear them until the inflammation is gone and your health care provider says it is safe to wear them again. Ask your health care provider how to sterilize or replace your contact lenses before using them again. Wear glasses until you can resume wearing  contacts.  Avoid wearing eye makeup until the inflammation is gone. Throw away any old eye cosmetics that may be contaminated.  Change or wash your pillowcase every day.  Do not share towels or washcloths. This may spread the infection.  Wash your hands often with soap and water. Use paper towels to dry your hands.  Gently wipe away any drainage from your eye with a warm, wet washcloth or a cotton ball.  Be very careful to avoid touching the edge of the eyelid with the eye drop bottle or ointment tube when applying medicines to the affected eye. This will stop you from spreading the infection to the other eye or to other people. SEEK MEDICAL CARE IF:   Your symptoms do not improve with treatment.  You have increased pain.  Your vision becomes blurry.  You have a fever.  You have facial pain, redness, or swelling.  You have new symptoms.  Your symptoms get worse.   This information is not intended to replace advice given to you by your health care provider. Make sure you discuss any questions you have with your health care provider.   Document Released: 02/18/2003 Document Revised: 05/22/2006 Document Reviewed: 09/09/2014 Elsevier Interactive Patient Education 2016 [employer]  

## 2016-02-25 NOTE — ED Provider Notes (Signed)
CSN: [id number]     Arrival date & time 02/25/16  0630 History   First MD Initiated Contact with Patient 02/25/16 0703     Chief Complaint  Patient presents with  . Eye Drainage    HPI Comments: 25 year old male present with red eyes for 3 days. Redness started in his left eye first and then moved to his right eye. Reports associated watery discharge, eyelid edema, photophobia, "gritty" sensation, seasonal allergies. Takes Allegra without relief. Denies fever, decreased vision, eye itching or pain, sneezing, other allergy like symptoms, sick contacts. Does not wear contacts. The history is provided by the patient.    Past Medical History  Diagnosis Date  . Raynaud's disease   . Sickle cell trait (HCC)   . Mandible fracture (HCC) 06/20/2013  . 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 Abrasion of thumb, left 08/05/2013   Past Surgical History  Procedure Laterality Date  . Orif mandibular fracture N/A 06/20/2013    Procedure: OPEN REDUCTION INTERNAL FIXATION (ORIF) MANDIBULAR FRACTURE;  Surgeon: Serena Colonel, MD;  Location: [facility];  Service: ENT;  Laterality: N/A;  . Mandibular hardware removal N/A 08/06/2013    Procedure: MANDIBULAR HARDWARE REMOVAL;  Surgeon: Serena Colonel, MD;  Location: [facility];  Service: ENT;  Laterality: N/A;   Family History  Problem Relation Age of Onset  . Hypertension Mother   . Hypertension Other    Social History  Substance Use Topics  . Smoking status: Current Every Day Smoker -- 2 years    Types: Cigarettes  . Smokeless tobacco: Never Used     Comment: 3 cig./day  . Alcohol Use: Yes     Comment: occasionally    Review of Systems  Constitutional: Negative for fever.  HENT: Negative for congestion, rhinorrhea, sinus pressure and sneezing.   Eyes: Positive for photophobia, discharge and redness. Negative for pain, itching and visual disturbance.  Respiratory: Negative for cough and wheezing.   Allergic/Immunologic: Positive for  environmental allergies.    Allergies  Sulfa antibiotics  Home Medications   Prior to Admission medications   Medication Sig Start Date End Date Taking? Authorizing Provider  HYDROcodone-acetaminophen (HYCET) 7.5-325 mg/15 ml solution Take 15 mLs by mouth every 4 (four) hours as needed for pain. 06/20/13   Serena Colonel, MD   BP 139/85 mmHg  Pulse 62  Temp(Src) 98.4 F (36.9 C) (Oral)  Resp 20  Ht  (1.651 m)  Wt 61.236 kg  BMI 22.47 kg/m2  SpO2 100%   Physical Exam  Constitutional: He is oriented to person, place, and time. He appears well-developed and well-nourished. No distress.  HENT:  Head: Normocephalic and atraumatic.  Nose: Nose normal.  Eyes: EOM are normal. Pupils are equal, round, and reactive to light. Right eye exhibits chemosis and discharge. Right eye exhibits no exudate and no hordeolum. No foreign body present in the right eye. Left eye exhibits chemosis and discharge. Left eye exhibits no exudate and no hordeolum. No foreign body present in the left eye. Right conjunctiva is injected. Right conjunctiva has no hemorrhage. Left conjunctiva is injected. Left conjunctiva has no hemorrhage. No scleral icterus.  Diffuse injection of the conjunctiva bilaterally No lesions or ulcerations seen  Pulmonary/Chest: Effort normal.  Lymphadenopathy:    He has no cervical adenopathy.  Neurological: He is alert and oriented to person, place, and time.  Skin: Skin is warm and dry.  Psychiatric: He has a normal mood  and affect.    ED Course  Procedures (including critical care time)   MDM   Final diagnoses:  Viral conjunctivitis   Pt symptoms are most likely viral. He has watery discharge that started unilaterally and became bilateral with a gritty sensation. No purulent drainage, extreme pain, orbit erythema/edema, punctate lesions. Will treat with erythromycin ointment (TID for 3 days). Patient informed of clinical course, understand medical decision-making process,  and agree with plan.   Bethel BornKelly Marie Tayo Maute, PA-C 02/25/16 [id number]  Gerhard Munchobert Lockwood, MD 02/26/16 [phone]

## 2017-08-07 ENCOUNTER — Emergency Department (HOSPITAL_COMMUNITY): Payer: PRIVATE HEALTH INSURANCE

## 2017-08-07 ENCOUNTER — Encounter (HOSPITAL_COMMUNITY): Payer: Self-pay

## 2017-08-07 ENCOUNTER — Emergency Department (HOSPITAL_COMMUNITY)
Admission: EM | Admit: 2017-08-07 | Discharge: 2017-08-07 | Disposition: A | Discharge status: Home / Self Care | Payer: PRIVATE HEALTH INSURANCE | Attending: Emergency Medicine | Admitting: Emergency Medicine

## 2017-08-07 DIAGNOSIS — Y939 Activity, unspecified: Secondary | ICD-10-CM | POA: Insufficient documentation

## 2017-08-07 DIAGNOSIS — F17210 Nicotine dependence, cigarettes, uncomplicated: Secondary | ICD-10-CM | POA: Insufficient documentation

## 2017-08-07 DIAGNOSIS — Y999 Unspecified external cause status: Secondary | ICD-10-CM | POA: Insufficient documentation

## 2017-08-07 DIAGNOSIS — Y929 Unspecified place or not applicable: Secondary | ICD-10-CM | POA: Insufficient documentation

## 2017-08-07 DIAGNOSIS — S46912A Strain of unspecified muscle, fascia and tendon at shoulder and upper arm level, left arm, initial encounter: Secondary | ICD-10-CM | POA: Insufficient documentation

## 2017-08-07 DIAGNOSIS — D573 Sickle-cell trait: Secondary | ICD-10-CM | POA: Insufficient documentation

## 2017-08-07 DIAGNOSIS — X503XXA Overexertion from repetitive movements, initial encounter: Secondary | ICD-10-CM | POA: Insufficient documentation

## 2017-08-07 MED ORDER — MELOXICAM 15 MG PO TABS: 15.0000 mg | ORAL_TABLET | Freq: Every day | ORAL | 0 refills | Status: AC

## 2017-08-07 NOTE — Discharge Instructions (Signed)
Ice several times a day. Avoid any heavy lifting. Mobic was prescribed for pain and inflammation. Follow up with orthopedics if not improving.

## 2017-08-07 NOTE — ED Triage Notes (Signed)
Patient c/o left shoulder pain x 8 days. 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 Patient states everyday activities I.e. Folding clothes, holding a plate etc. Makes his shoulder hurt.

## 2017-08-07 NOTE — ED Provider Notes (Signed)
[facility]-EMERGENCY DEPT Provider Note   CSN: [id number] Arrival date & time: 08/07/17  4098     History   Chief Complaint Chief Complaint  Patient presents with  . Shoulder Pain    HPI Corey Flores is a 26 y.o. male.  HPI Corey Flores is a 26 y.o. male presents to emergency department complaining of left shoulder pain. Patient states that he does a lot of lifting for his job. He reports starting to feel some pain in his left shoulder about 8 days ago. He denies any particular injury. he states pain is worsened with movement of the shoulder. Denies any pain radiating down to his hand. Denies any tingling or numbness. Denies any weakness. He states he is unable to sleep on the left side due to pain. He has not tried any medications. He states he hasn't been able to work over the last several days because of the injury. He denies any prior history of shoulder issues.  Past Medical History:  Diagnosis Date  . Abrasion of thumb, left 08/05/2013  . 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 Mandible fracture (HCC) 06/20/2013  . Raynaud's disease   . Sickle cell trait (HCC)     There are no active problems to display for this patient.   Past Surgical History:  Procedure Laterality Date  . MANDIBULAR HARDWARE REMOVAL N/A 08/06/2013   Procedure: MANDIBULAR HARDWARE REMOVAL;  Surgeon: Serena Colonel, MD;  Location: [facility];  Service: ENT;  Laterality: N/A;  . ORIF MANDIBULAR FRACTURE N/A 06/20/2013   Procedure: OPEN REDUCTION INTERNAL FIXATION (ORIF) MANDIBULAR FRACTURE;  Surgeon: Serena Colonel, MD;  Location: [facility];  Service: ENT;  Laterality: N/A;       Home Medications    Prior to Admission medications   Medication Sig Start Date End Date Taking? Authorizing Provider  HYDROcodone-acetaminophen (HYCET) 7.5-325 mg/15 ml solution Take 15 mLs by mouth every 4 (four) hours as needed for pain. 06/20/13   Serena Colonel, MD    Family History Family History  Problem  Relation Age of Onset  . Hypertension Mother   . Hypertension Other     Social History Social History  Substance Use Topics  . Smoking status: Current Every Day Smoker    Years: 2.00    Types: Cigarettes  . Smokeless tobacco: Never Used     Comment: 3 cig./day  . Alcohol use Yes     Comment: occasionally     Allergies   Sulfa antibiotics   Review of Systems Review of Systems  Constitutional: Negative for chills and fever.  Respiratory: Negative for cough, chest tightness and shortness of breath.   Cardiovascular: Negative for chest pain, palpitations and leg swelling.  Musculoskeletal: Positive for arthralgias. Negative for joint swelling, myalgias, neck pain and neck stiffness.  Skin: Negative for rash.  Allergic/Immunologic: Negative for immunocompromised state.  Neurological: Negative for weakness and numbness.     Physical Exam Updated Vital Signs BP 110/83 (BP Location: Right Arm)   Pulse 92   Temp 98.3 F (36.8 C) (Oral)   Resp 16   Ht 5' 5.5" (1.664 m)   Wt 63.5 kg (140 lb)   SpO2 100%   BMI 22.94 kg/m   Physical Exam  Constitutional: He appears well-developed and well-nourished. No distress.  Eyes: Conjunctivae are normal.  Neck: Neck supple.  Cardiovascular: Normal rate.   Pulmonary/Chest: No respiratory distress.  Abdominal: He exhibits no distension.  Musculoskeletal:  Normal appearing  left shoulder. Tender to palpation of the posterior joint. No tenderness over before meals joint. Full range of motion of the shoulder. Pain with internal rotation. Negative arm drop test. Pain with internal rotation and crossing arm in front of the body. Distal radial pulses intact. Sensation intact. Grip 5/5  Skin: Skin is warm and dry.  Nursing note and vitals reviewed.    ED Treatments / Results  Labs (all labs ordered are listed, but only abnormal results are displayed) Labs Reviewed - No data to display  EKG  EKG Interpretation None        Radiology Dg Shoulder Left  Result Date: 08/07/2017 CLINICAL DATA:  Left shoulder pain. EXAM: LEFT SHOULDER - 2+ VIEW COMPARISON:  No recent . FINDINGS: No acute bony abnormality identified. No evidence of fracture or dislocation. IMPRESSION: No acute or focal abnormality . Electronically Signed   By: Maisie Fus  Register   On: 08/07/2017 10:01    Procedures Procedures (including critical care time)  Medications Ordered in ED Medications - No data to display   Initial Impression / Assessment and Plan / ED Course  I have reviewed the triage vital signs and the nursing notes.  Pertinent labs & imaging results that were available during my care of the patient were reviewed by me and considered in my medical decision making (see chart for details).     Patient with left shoulder pain, no particular trauma but does heavy lifting. Pain mainly worsened with full flexion and internal rotation. Question impingement syndrome versus rotator cuff strain. Joint is stable, negative arm drop test, I do not think patient has full tearing his rotator cuff muscle. Discussed with patient who agrees.   Vitals:   08/07/17 0932 08/07/17 0934  BP: 110/83   Pulse: 92   Resp: 16   Temp: 98.3 F (36.8 C)   TempSrc: Oral   SpO2: 100%   Weight:  63.5 kg (140 lb)  Height:  5' 5.5" (1.664 m)     Final Clinical Impressions(s) / ED Diagnoses   Final diagnoses:  Strain of left shoulder, initial encounter    New Prescriptions New Prescriptions   MELOXICAM (MOBIC) 15 MG TABLET    Take 1 tablet (15 mg total) by mouth daily.     Jaynie Crumble, PA-C 08/07/17 1229    Charlynne Pander, MD 08/08/17 [phone]

## 2017-08-07 NOTE — ED Notes (Signed)
Bed: [facility] Expected date:  Expected time:  Means of arrival:  Comments: 

## 2017-09-28 ENCOUNTER — Emergency Department (HOSPITAL_COMMUNITY): Payer: Self-pay

## 2017-09-28 ENCOUNTER — Emergency Department (HOSPITAL_COMMUNITY)
Admission: EM | Admit: 2017-09-28 | Discharge: 2017-09-28 | Disposition: A | Discharge status: Home / Self Care | Payer: Self-pay | Attending: Emergency Medicine | Admitting: Emergency Medicine

## 2017-09-28 ENCOUNTER — Encounter (HOSPITAL_COMMUNITY): Payer: Self-pay | Admitting: Emergency Medicine

## 2017-09-28 DIAGNOSIS — D573 Sickle-cell trait: Secondary | ICD-10-CM | POA: Insufficient documentation

## 2017-09-28 DIAGNOSIS — Z79899 Other long term (current) drug therapy: Secondary | ICD-10-CM | POA: Insufficient documentation

## 2017-09-28 DIAGNOSIS — F17210 Nicotine dependence, cigarettes, uncomplicated: Secondary | ICD-10-CM | POA: Insufficient documentation

## 2017-09-28 DIAGNOSIS — M25562 Pain in left knee: Secondary | ICD-10-CM | POA: Insufficient documentation

## 2017-09-28 DIAGNOSIS — Z791 Long term (current) use of non-steroidal anti-inflammatories (NSAID): Secondary | ICD-10-CM | POA: Insufficient documentation

## 2017-09-28 MED ORDER — IBUPROFEN 200 MG PO TABS
600.0000 mg | ORAL_TABLET | Freq: Once | ORAL | Status: AC
  Administered 2017-09-28: 600 mg via ORAL
  Filled 2017-09-28: qty 3

## 2017-09-28 NOTE — ED Triage Notes (Signed)
Pt reports L knee pain for the past few days. Pt has been walking more than usual at work, and walked for a long time 3 days ago. No known acute injury. Tried ibuprofen and a knee sleeve, but still has pain. Pt ambulatory. No swelling or deformity noted. Pt ambulatory with steady gait.

## 2017-09-28 NOTE — Discharge Instructions (Signed)
X-ray showed no fracture. May have a possible meniscal tear. Please wear the knee brace for comfort and use the crutches for ambulating with weightbearing as tolerated. Motrin and Tylenol for pain.  Please rest, ice, compress and elevated the affected body part to help with swelling and pain.  Follow-up with orthopedic doctor if symptoms not improving. Return to the ED if he develops any worsening swelling, fevers, redness of the area.

## 2017-09-28 NOTE — ED Notes (Signed)
Bed: WTR9 Expected date:  Expected time:  Means of arrival:  Comments: 

## 2017-09-28 NOTE — ED Provider Notes (Signed)
[facility]-EMERGENCY DEPT Provider Note   CSN: [id number] Arrival date & time: 09/28/17  1052     History   Chief Complaint Chief Complaint  Patient presents with  . Knee Pain    HPI Corey Flores is a 26 y.o. male.  HPI Is a 69 african [place] male presents to the emergency department today with complaints of left knee pain. Patient states his pain started 3 days ago after prolonged walking. Denies any trauma to the area. States that since then the pain is rigorously worsen. Worse with ambulation and palpation. Reports minimal swelling to the area. Patient was wearing a knee brace that provide him some comfort however the pain is continuing. States the pain is 10 out of 10. He has not tried any medications for his pain prior to arrival. Denies any associated paresthesias, weakness, erythema, fevers, vomiting, color change. Past Medical History:  Diagnosis Date  . Abrasion of thumb, left 08/05/2013  . 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 Mandible fracture (HCC) 06/20/2013  . Raynaud's disease   . Sickle cell trait (HCC)     There are no active problems to display for this patient.   Past Surgical History:  Procedure Laterality Date  . MANDIBULAR HARDWARE REMOVAL N/A 08/06/2013   Procedure: MANDIBULAR HARDWARE REMOVAL;  Surgeon: Serena Colonel, MD;  Location: [facility];  Service: ENT;  Laterality: N/A;  . ORIF MANDIBULAR FRACTURE N/A 06/20/2013   Procedure: OPEN REDUCTION INTERNAL FIXATION (ORIF) MANDIBULAR FRACTURE;  Surgeon: Serena Colonel, MD;  Location: [facility];  Service: ENT;  Laterality: N/A;       Home Medications    Prior to Admission medications   Medication Sig Start Date End Date Taking? Authorizing Provider  aspirin-acetaminophen-caffeine (EXCEDRIN MIGRAINE) [phone] MG tablet Take 1-2 tablets by mouth every 6 (six) hours as needed for headache.    [provider]  ibuprofen (ADVIL,MOTRIN) 200 MG tablet Take  400 mg by mouth every 6 (six) hours as needed for fever or moderate pain.    [provider]  meloxicam (MOBIC) 15 MG tablet Take 1 tablet (15 mg total) by mouth daily. 08/07/17   Jaynie Crumble, PA-C    Family History Family History  Problem Relation Age of Onset  . Hypertension Mother   . Hypertension Other     Social History Social History  Substance Use Topics  . Smoking status: Current Every Day Smoker    Years: 2.00    Types: Cigarettes  . Smokeless tobacco: Never Used     Comment: 3 cig./day  . Alcohol use Yes     Comment: occasionally     Allergies   Sulfa antibiotics   Review of Systems Review of Systems  Constitutional: Negative for chills and fever.  Musculoskeletal: Positive for arthralgias, gait problem, joint swelling and myalgias. Negative for back pain.  Skin: Negative for color change and wound.  Neurological: Negative for weakness and numbness.     Physical Exam Updated Vital Signs BP 134/89   Pulse 63   Temp 98 F (36.7 C) (Oral)   Resp 16   SpO2 99%   Physical Exam  Constitutional: He appears well-developed and well-nourished. No distress.  HENT:  Head: Normocephalic and atraumatic.  Eyes: Right eye exhibits no discharge. Left eye exhibits no discharge. No scleral icterus.  Neck: Normal range of motion.  Pulmonary/Chest: No respiratory distress.  Musculoskeletal:       Left knee: He exhibits  decreased range of motion. He exhibits no swelling, no effusion, no ecchymosis, no deformity, no laceration, no erythema, normal alignment, no LCL laxity and normal patellar mobility. Tenderness found. Lateral joint line tenderness noted.  No joint laxity with valgus and varus stress. Negative anterior drawer test. DP pulses are 2+ bilaterally. Sensation intact. Cap refill is normal. The patient does have decreased range of motion of left knee but does cause him pain.  Neurological: He is alert.  Strength 5 out of 5 in lower x-rays.  Patellar reflexes are normal.  Skin: No pallor.  Psychiatric: His behavior is normal. Judgment and thought content normal.  Nursing note and vitals reviewed.    ED Treatments / Results  Labs (all labs ordered are listed, but only abnormal results are displayed) Labs Reviewed - No data to display  EKG  EKG Interpretation None       Radiology Dg Knee Complete 4 Views Left  Result Date: 09/28/2017 CLINICAL DATA:  Worsening anterior and lateral knee pain. EXAM: LEFT KNEE - COMPLETE 4+ VIEW COMPARISON:  None. FINDINGS: No evidence of fracture, dislocation, or joint effusion. No evidence of arthropathy or other focal bone abnormality. Soft tissues are unremarkable. IMPRESSION: Negative. Electronically Signed   By: Charlett NoseKevin  Dover M.D.   On: 09/28/2017 12:02    Procedures Procedures (including critical care time)  Medications Ordered in ED Medications  ibuprofen (ADVIL,MOTRIN) tablet 600 mg (not administered)     Initial Impression / Assessment and Plan / ED Course  I have reviewed the triage vital signs and the nursing notes.  Pertinent labs & imaging results that were available during my care of the patient were reviewed by me and considered in my medical decision making (see chart for details).     Patient resents to the ED with complaints of left knee pain for the past 3 days after walking for an extended amount of time. Patient is neurovascularly intact. No subcutaneous erythema, effusion, swelling noted. Patient does have some decreased range of motion due to the pain. No warmth noted. No signs of septic joint. Patient X-Ray negative for obvious fracture or dislocation. Pain managed in ED. possibly meniscal tear versus musculoskeletal pain. Doubt ligamentous injury. Pt advised to follow up with orthopedics if symptoms persist for possibility of missed fracture diagnosis. Patient given brace while in ED, conservative therapy recommended and discussed. Patient will be dc home & is  agreeable with above plan.   Final Clinical Impressions(s) / ED Diagnoses   Final diagnoses:  Acute pain of left knee    New Prescriptions New Prescriptions   No medications on file     Wallace KellerLeaphart, Natnael Biederman T, PA-C 09/28/17 1304    Alvira MondaySchlossman, Erin, MD 09/28/17 2121

## 2017-09-28 NOTE — ED Notes (Signed)
Pt to put on ice pack once he gets home

## 2020-01-19 ENCOUNTER — Emergency Department (HOSPITAL_COMMUNITY): Payer: PRIVATE HEALTH INSURANCE

## 2020-01-19 ENCOUNTER — Other Ambulatory Visit: Payer: Self-pay

## 2020-01-19 ENCOUNTER — Emergency Department (HOSPITAL_COMMUNITY)
Admission: EM | Admit: 2020-01-19 | Discharge: 2020-01-19 | Disposition: A | Discharge status: Home / Self Care | Payer: PRIVATE HEALTH INSURANCE | Attending: Emergency Medicine | Admitting: Emergency Medicine

## 2020-01-19 DIAGNOSIS — Y939 Activity, unspecified: Secondary | ICD-10-CM | POA: Insufficient documentation

## 2020-01-19 DIAGNOSIS — F17210 Nicotine dependence, cigarettes, uncomplicated: Secondary | ICD-10-CM | POA: Insufficient documentation

## 2020-01-19 DIAGNOSIS — Y929 Unspecified place or not applicable: Secondary | ICD-10-CM | POA: Insufficient documentation

## 2020-01-19 DIAGNOSIS — W208XXA Other cause of strike by thrown, projected or falling object, initial encounter: Secondary | ICD-10-CM | POA: Insufficient documentation

## 2020-01-19 DIAGNOSIS — Y999 Unspecified external cause status: Secondary | ICD-10-CM | POA: Insufficient documentation

## 2020-01-19 DIAGNOSIS — Z23 Encounter for immunization: Secondary | ICD-10-CM | POA: Insufficient documentation

## 2020-01-19 DIAGNOSIS — S61214A Laceration without foreign body of right ring finger without damage to nail, initial encounter: Secondary | ICD-10-CM

## 2020-01-19 MED ORDER — TETANUS-DIPHTH-ACELL PERTUSSIS 5-2.5-18.5 LF-MCG/0.5 IM SUSP
0.5000 mL | Freq: Once | INTRAMUSCULAR | Status: AC
  Administered 2020-01-19: 0.5 mL via INTRAMUSCULAR
  Filled 2020-01-19: qty 0.5

## 2020-01-19 MED ORDER — CEPHALEXIN 500 MG PO CAPS: 500.0000 mg | ORAL_CAPSULE | Freq: Four times a day (QID) | ORAL | 0 refills | Status: AC

## 2020-01-19 NOTE — Discharge Instructions (Signed)
Take the antibiotic as prescribed to prevent infection. If you develop any increasing swelling, fever, increased redness, redness that extends into the hand from the finger or drainage from the wound, return to the ED for further evaluation.

## 2020-01-19 NOTE — ED Triage Notes (Signed)
Per pt he was at home this evening and was cooking on the grill and the top fell down on his right ring finger. Pt does have a laceration to the tip of the finger and says painful to touch. Some swelling as well. Bleeding controlled

## 2020-01-19 NOTE — ED Provider Notes (Signed)
[facility] EMERGENCY DEPARTMENT Provider Note   CSN: [id number] Arrival date & time: 01/19/20  0253     History Chief Complaint  Patient presents with  . Hand Pain    right ring finger    Corey Flores is a 29 y.o. male.  Patient to ED with right 4th finger pain and laceration that occurred yesterday after the top to his grill fell closed on top of it. Laceration across pad of finger. He reports he did not come in initially because he felt it would heal, but hit the finger tonight causing additional bleeding. Last tetanus unknown.   The history is provided by the patient. No language interpreter was used.  Hand Pain       Past Medical History:  Diagnosis Date  . Abrasion of thumb, left 08/05/2013  . 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 Mandible fracture ([place]) 06/20/2013  . Raynaud's disease   . Sickle cell trait ([place])     There are no problems to display for this patient.   Past Surgical History:  Procedure Laterality Date  . MANDIBULAR HARDWARE REMOVAL N/A 08/06/2013   Procedure: MANDIBULAR HARDWARE REMOVAL;  Surgeon: Izora Gala, MD;  Location: [facility];  Service: ENT;  Laterality: N/A;  . ORIF MANDIBULAR FRACTURE N/A 06/20/2013   Procedure: OPEN REDUCTION INTERNAL FIXATION (ORIF) MANDIBULAR FRACTURE;  Surgeon: Izora Gala, MD;  Location: [facility];  Service: ENT;  Laterality: N/A;       Family History  Problem Relation Age of Onset  . Hypertension Mother   . Hypertension Other     Social History   Tobacco Use  . Smoking status: Current Every Day Smoker    Years: 2.00    Types: Cigarettes  . Smokeless tobacco: Never Used  . Tobacco comment: 3 cig./day  Substance Use Topics  . Alcohol use: Yes    Comment: occasionally  . Drug use: No    Home Medications Prior to Admission medications   Medication Sig Start Date End Date Taking? Authorizing Provider  aspirin-acetaminophen-caffeine (EXCEDRIN MIGRAINE) [phone]  MG tablet Take 1-2 tablets by mouth every 6 (six) hours as needed for headache.    [provider]  ibuprofen (ADVIL,MOTRIN) 200 MG tablet Take 400 mg by mouth every 6 (six) hours as needed for fever or moderate pain.    [provider]  meloxicam (MOBIC) 15 MG tablet Take 1 tablet (15 mg total) by mouth daily. 08/07/17   Kirichenko, Lahoma Rocker, PA-C    Allergies    Sulfa antibiotics  Review of Systems   Review of Systems  Musculoskeletal:       See HPI.  Skin: Positive for wound.    Physical Exam Updated Vital Signs BP (!) 136/100 (BP Location: Right Arm)   Pulse 92   Temp 98.1 F (36.7 C) (Oral)   Resp 16   SpO2 100%   Physical Exam Constitutional:      Appearance: He is well-developed.  Pulmonary:     Effort: Pulmonary effort is normal.  Musculoskeletal:        General: Normal range of motion.     Cervical back: Normal range of motion.     Comments: Right 4th finger swollen distal to the DIP joint. There is a 1 cm linear laceration across mid-pad. Nail intact without subungual hematoma. No active bleeding.   Skin:    General: Skin is warm and dry.  Neurological:  Mental Status: He is alert and oriented to person, place, and time.     ED Results / Procedures / Treatments   Labs (all labs ordered are listed, but only abnormal results are displayed) Labs Reviewed - No data to display  EKG None  Radiology No results found.  Procedures Procedures (including critical care time)  Medications Ordered in ED Medications - No data to display  ED Course  I have reviewed the triage vital signs and the nursing notes.  Pertinent labs & imaging results that were available during my care of the patient were reviewed by me and considered in my medical decision making (see chart for details).    MDM Rules/Calculators/A&P                      Patient to ED with right 4th finger injury as detailed in the HPI.  Laceration will not be sutured secondary  to delayed presentation but is not wide gaping and is expected to heal properly. Will start Keflex x 7 days to prevent infection. X-ray is not felt indicated even with the likelihood of tuft fracture given mechanism and location, it will not change treatment course.  Final Clinical Impression(s) / ED Diagnoses Final diagnoses:  None   1. Right ring finger laceration  Rx / DC Orders ED Discharge Orders    None       Elpidio Anis, PA-C 01/19/20 0353    Mesner, Barbara Cower, MD 01/19/20 [id number]

## 2020-01-19 NOTE — ED Notes (Signed)
Patient verbalizes understanding of discharge instructions. Opportunity for questioning and answers were provided. Armband removed by staff, pt discharged from ED. Pt. ambulatory and discharged home.  

## 2020-12-22 ENCOUNTER — Encounter (HOSPITAL_COMMUNITY): Payer: Self-pay | Admitting: Emergency Medicine

## 2020-12-22 ENCOUNTER — Emergency Department (HOSPITAL_COMMUNITY): Payer: Self-pay

## 2020-12-22 ENCOUNTER — Emergency Department (HOSPITAL_COMMUNITY)
Admission: EM | Admit: 2020-12-22 | Discharge: 2020-12-22 | Disposition: A | Discharge status: Home / Self Care | Payer: Self-pay | Attending: Emergency Medicine | Admitting: Emergency Medicine

## 2020-12-22 ENCOUNTER — Other Ambulatory Visit: Payer: Self-pay

## 2020-12-22 DIAGNOSIS — F17210 Nicotine dependence, cigarettes, uncomplicated: Secondary | ICD-10-CM | POA: Insufficient documentation

## 2020-12-22 DIAGNOSIS — Z7982 Long term (current) use of aspirin: Secondary | ICD-10-CM | POA: Insufficient documentation

## 2020-12-22 DIAGNOSIS — M25512 Pain in left shoulder: Secondary | ICD-10-CM | POA: Insufficient documentation

## 2020-12-22 DIAGNOSIS — M542 Cervicalgia: Secondary | ICD-10-CM | POA: Insufficient documentation

## 2020-12-22 NOTE — ED Provider Notes (Signed)
[facility]-EMERGENCY DEPT Provider Note   CSN: [id number] Arrival date & time: 12/22/20  0855     History Chief Complaint  Patient presents with  . Neck Pain    Corey Flores is a 30 y.o. male.  HPI Who presents for evaluation of several days of pain in left neck, left shoulder and left upper arm.  Symptoms started with neck discomfort.  No known trauma.  He has been using ibuprofen with partial relief.  No prior similar problems or evaluations for same.  There are no other known modifying factors.    Past Medical History:  Diagnosis Date  . Abrasion of thumb, left 08/05/2013  . 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 Mandible fracture ([facility]) 06/20/2013  . Raynaud's disease   . Sickle cell trait ([facility])     There are no problems to display for this patient.   Past Surgical History:  Procedure Laterality Date  . MANDIBULAR HARDWARE REMOVAL N/A 08/06/2013   Procedure: MANDIBULAR HARDWARE REMOVAL;  Surgeon: Serena Colonel, MD;  Location: [facility];  Service: ENT;  Laterality: N/A;  . ORIF MANDIBULAR FRACTURE N/A 06/20/2013   Procedure: OPEN REDUCTION INTERNAL FIXATION (ORIF) MANDIBULAR FRACTURE;  Surgeon: Serena Colonel, MD;  Location: [facility];  Service: ENT;  Laterality: N/A;       Family History  Problem Relation Age of Onset  . Hypertension Mother   . Hypertension Other     Social History   Tobacco Use  . Smoking status: Current Every Day Smoker    Years: 2.00    Types: Cigarettes  . Smokeless tobacco: Never Used  . Tobacco comment: 3 cig./day  Vaping Use  . Vaping Use: Never used  Substance Use Topics  . Alcohol use: Yes    Comment: occasionally  . Drug use: No    Home Medications Prior to Admission medications   Medication Sig Start Date End Date Taking? Authorizing Provider  aspirin-acetaminophen-caffeine (EXCEDRIN MIGRAINE) [phone] MG tablet Take 1-2 tablets by mouth every 6 (six) hours as needed for headache.     [provider]  cephALEXin (KEFLEX) 500 MG capsule Take 1 capsule (500 mg total) by mouth 4 (four) times daily. 01/19/20   Elpidio Anis, PA-C  ibuprofen (ADVIL,MOTRIN) 200 MG tablet Take 400 mg by mouth every 6 (six) hours as needed for fever or moderate pain.    [provider]  meloxicam (MOBIC) 15 MG tablet Take 1 tablet (15 mg total) by mouth daily. 08/07/17   Kirichenko, Lemont Fillers, PA-C    Allergies    Sulfa antibiotics  Review of Systems   Review of Systems  All other systems reviewed and are negative.   Physical Exam Updated Vital Signs BP 128/90 (BP Location: Right Arm)   Pulse (!) 53   Temp 98.1 F (36.7 C) (Oral)   Resp 18   Ht 5\' 5"  (1.651 m)   Wt 60.8 kg   SpO2 100%   BMI 22.30 kg/m   Physical Exam Vitals and nursing note reviewed.  Constitutional:      General: He is not in acute distress.    Appearance: He is well-developed and well-nourished. He is not ill-appearing, toxic-appearing or diaphoretic.  HENT:     Head: Normocephalic and atraumatic.     Right Ear: External ear normal.     Left Ear: External ear normal.  Eyes:     Extraocular Movements: EOM normal.     Conjunctiva/sclera: Conjunctivae  normal.     Pupils: Pupils are equal, round, and reactive to light.  Neck:     Trachea: Phonation normal.  Cardiovascular:     Rate and Rhythm: Normal rate.  Pulmonary:     Effort: Pulmonary effort is normal.  Chest:     Chest wall: No bony tenderness.  Abdominal:     General: There is no distension.  Musculoskeletal:        General: Normal range of motion.     Cervical back: Normal range of motion and neck supple.     Comments: No altered motion of neck, arms or legs.  Skin:    General: Skin is warm, dry and intact.  Neurological:     Mental Status: He is alert and oriented to person, place, and time.     Cranial Nerves: No cranial nerve deficit.     Sensory: No sensory deficit.     Motor: No abnormal muscle tone.     Coordination:  Coordination normal.  Psychiatric:        Mood and Affect: Mood and affect and mood normal.        Behavior: Behavior normal.        Thought Content: Thought content normal.        Judgment: Judgment normal.     ED Results / Procedures / Treatments   Labs (all labs ordered are listed, but only abnormal results are displayed) Labs Reviewed - No data to display  EKG None  Radiology DG Cervical Spine Complete  Result Date: 12/22/2020 CLINICAL DATA:  Left neck pain radiating into left shoulder and arm for 3 months. EXAM: CERVICAL SPINE - COMPLETE 4+ VIEW COMPARISON:  None. FINDINGS: The alignment within normal limits. There is partial fusion between C3 and C4 vertebral bodies and facets. Mild to moderate left neural foraminal stenosis at T1-T2. Neural foramina of the cervical levels are patent. No significant disc or vertebral body height loss. IMPRESSION: No significant degenerative changes of the cervical spine. Partial interbody fusion at C2-C3. Electronically Signed   By: Acquanetta Belling M.D.   On: 12/22/2020 11:46    Procedures Procedures (including critical care time)  Medications Ordered in ED Medications - No data to display  ED Course  I have reviewed the triage vital signs and the nursing notes.  Pertinent labs & imaging results that were available during my care of the patient were reviewed by me and considered in my medical decision making (see chart for details).    MDM Rules/Calculators/A&P                           Patient Vitals for the past 24 hrs:  BP Temp Temp src Pulse Resp SpO2 Height Weight  12/22/20 0920 128/90 98.1 F (36.7 C) Oral (!) 53 18 100 % -- --  12/22/20 0919 -- -- -- -- -- -- 5\' 5"  (1.651 m) 60.8 kg    12:28 PM Reevaluation with update and discussion. After initial assessment and treatment, an updated evaluation reveals he remains comfortable has no further complaints.  Findings discussed and questions answered.   Medical  Decision Making:  This patient is presenting for evaluation of neck, shoulder and left arm pain, which does not require a range of treatment options, and is not a complaint that involves a high risk of morbidity and mortality. The differential diagnoses include degenerative joint disease, radiculopathy, muscle pain. I decided to review old records, and  in summary Young male with atraumatic neck, left shoulder and left arm pain.  I did not require additional historical information from anyone.   Radiologic Tests Ordered, included cervical spine x-ray.  I independently Visualized: Radiographic images, which show no acute or chronic abnormalities    Critical Interventions-clinical evaluation, radiography, observation and reassess  After These Interventions, the Patient was reevaluated and was found stable for discharge.  Suspect nonspecific muscle pain, possibly spasm causing discomfort.  No indication for further intervention at this time  CRITICAL CARE-no Performed by: Mancel Bale  Nursing Notes Reviewed/ Care Coordinated Applicable Imaging Reviewed Interpretation of Laboratory Data incorporated into ED treatment  The patient appears reasonably screened and/or stabilized for discharge and I doubt any other medical condition or other [facility] requiring further screening, evaluation, or treatment in the ED at this time prior to discharge.  Plan: Home Medications-symptomatic OTC medication; Home Treatments-heat to affected areas; return here if the recommended treatment, does not improve the symptoms; Recommended follow up-PCP, PR     Final Clinical Impression(s) / ED Diagnoses Final diagnoses:  Neck pain    Rx / DC Orders ED Discharge Orders    None       Mancel Bale, MD 12/22/20 1230

## 2020-12-22 NOTE — Discharge Instructions (Addendum)
There were no serious problems found.  You are likely experiencing muscle pain and/or muscle spasms.  To treat that use heat on the sore areas 3-4 times a day.  Also use Motrin, or Tylenol as needed for pain.  See the doctor of your choice for ongoing problems.

## 2020-12-22 NOTE — ED Triage Notes (Signed)
Per pt, states left neck pain radiating to left shoulder and arm-wants a xray

## 2022-07-20 IMAGING — CR DG CERVICAL SPINE COMPLETE 4+V
6 series · 6 of 6 positions shown · non-contrast
Comparison: None.

CLINICAL DATA: Left neck pain radiating into left shoulder and arm
for 3 months.

EXAM:
CERVICAL SPINE - COMPLETE 4+ VIEW

[w cervical spine lat]
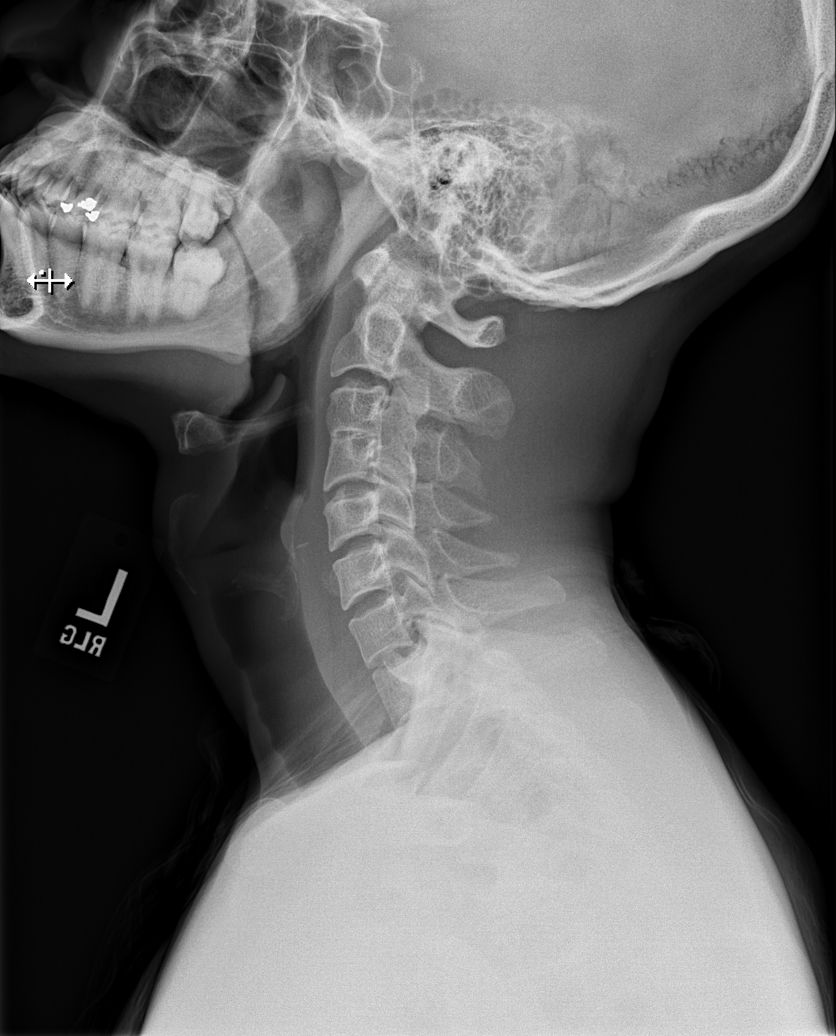

[w cervical spine ap_obl (1 of 2)]
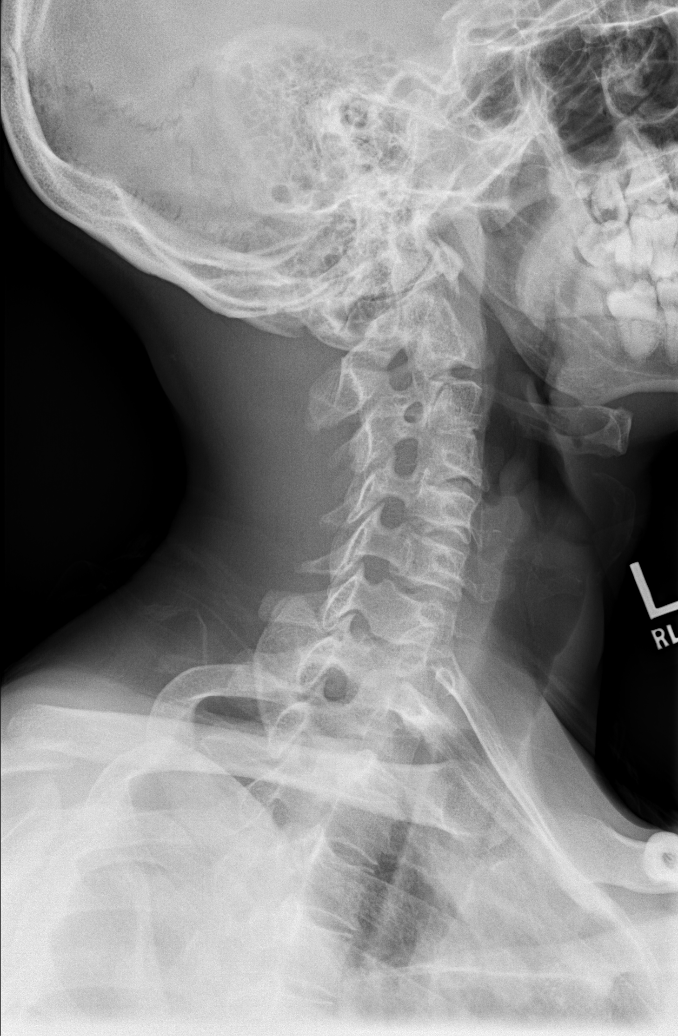

[w cervical spine ap_obl (2 of 2)]
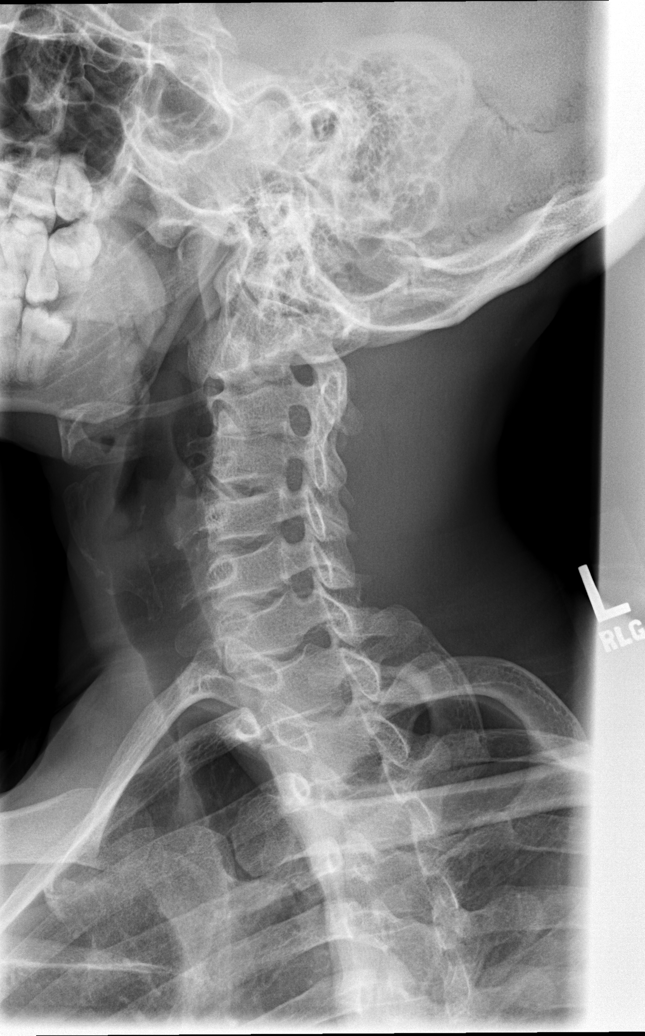

[w cervical spine ap]
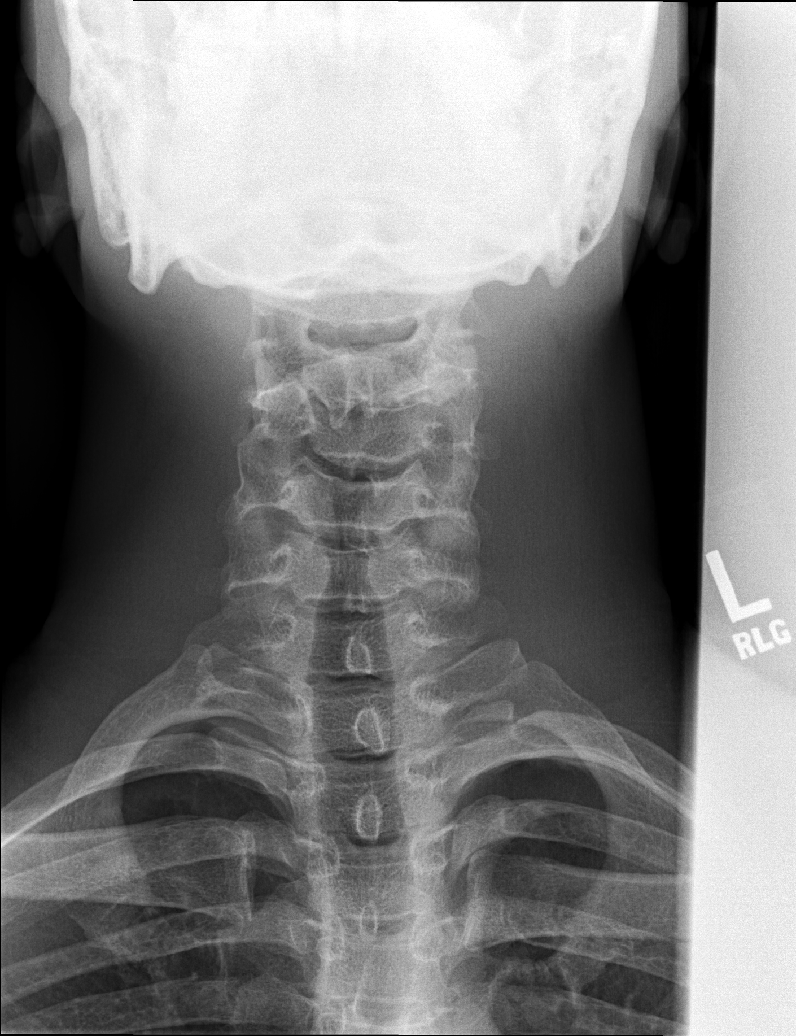

[w cervical spine odontoid (1 of 2)]
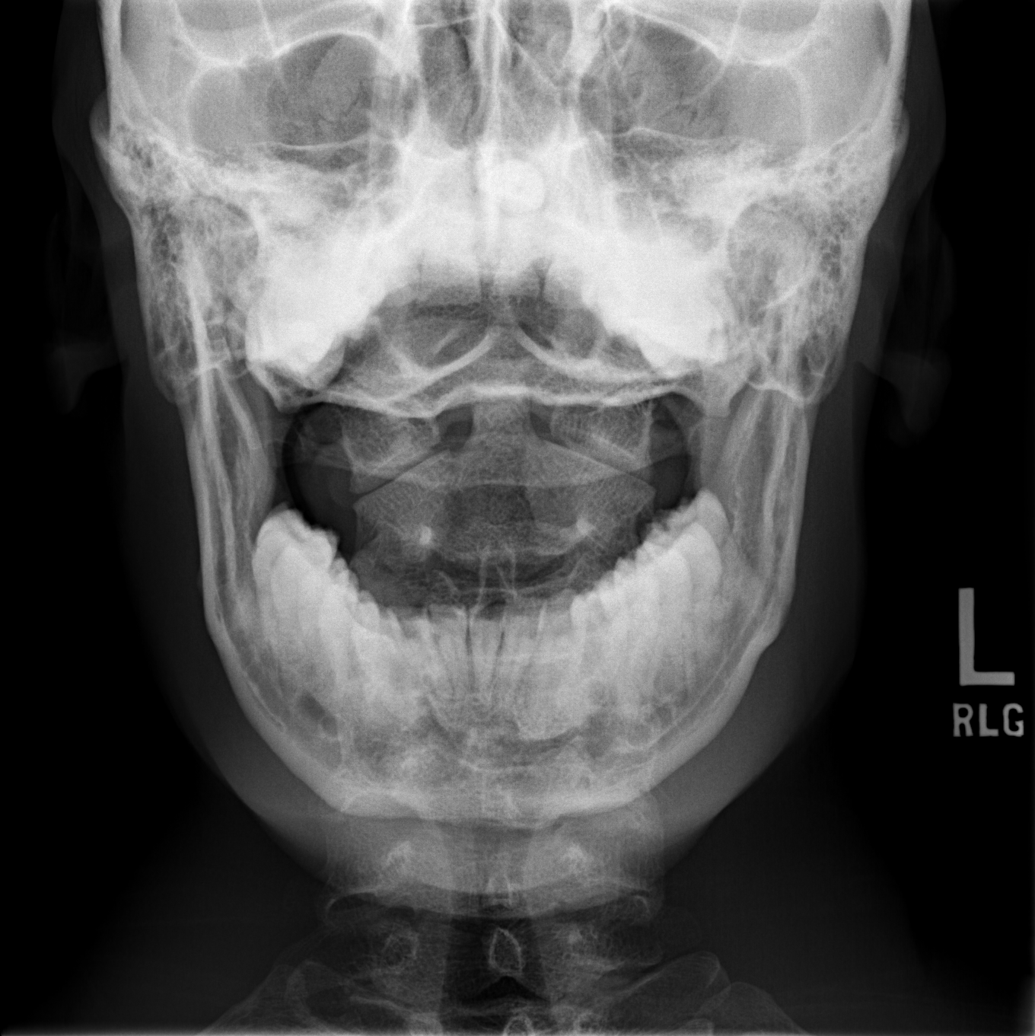

[w cervical spine odontoid (2 of 2)]
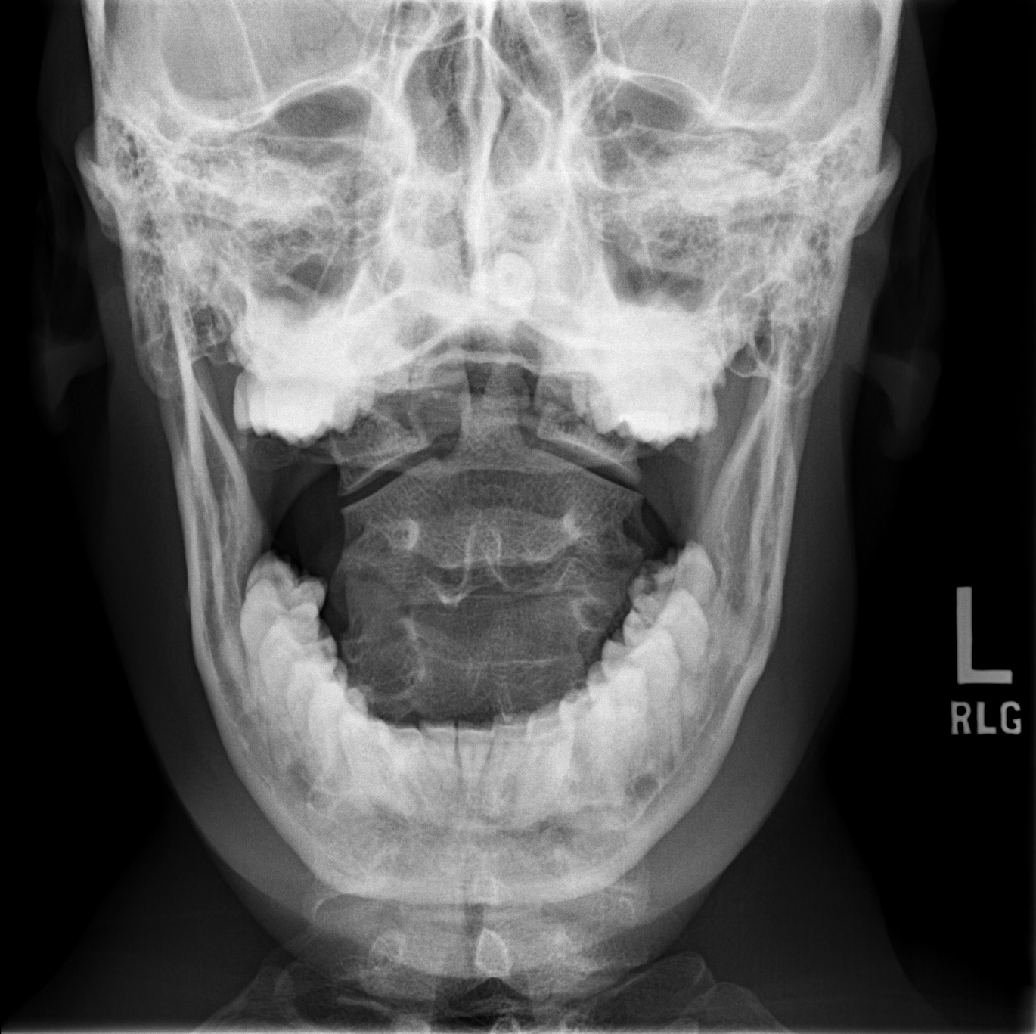

[6 of 6 positions shown; findings below may reference images not displayed]

FINDINGS: The alignment within normal limits. There is partial fusion between
C3 and C4 vertebral bodies and facets.

Mild to moderate left neural foraminal stenosis at T1-T2. Neural
foramina of the cervical levels are patent.

No significant disc or vertebral body height loss.
IMPRESSION: No significant degenerative changes of the cervical spine. Partial
interbody fusion at C2-C3.
